# Patient Record
Sex: Male | Born: 1980 | Hispanic: No | Smoking: Never smoker
Health system: Southern US, Community
[De-identification: ages and names within clinical notes are randomized; demographics above are authoritative.]

## PROBLEM LIST (undated history)

## (undated) DIAGNOSIS — G8929 Other chronic pain: Secondary | ICD-10-CM

## (undated) DIAGNOSIS — M1990 Unspecified osteoarthritis, unspecified site: Secondary | ICD-10-CM

## (undated) DIAGNOSIS — W19XXXA Unspecified fall, initial encounter: Secondary | ICD-10-CM

## (undated) DIAGNOSIS — R519 Headache, unspecified: Secondary | ICD-10-CM

## (undated) DIAGNOSIS — Z973 Presence of spectacles and contact lenses: Secondary | ICD-10-CM

## (undated) DIAGNOSIS — G43909 Migraine, unspecified, not intractable, without status migrainosus: Secondary | ICD-10-CM

## (undated) HISTORY — PX: OTHER SURGICAL HISTORY: SHX169

## (undated) HISTORY — PX: SHOULDER SURGERY: SHX246

---

## 2000-04-06 ENCOUNTER — Emergency Department (HOSPITAL_COMMUNITY): Admission: EM | Admit: 2000-04-06 | Discharge: 2000-04-06 | Payer: Self-pay | Admitting: Emergency Medicine

## 2004-05-28 ENCOUNTER — Emergency Department (HOSPITAL_COMMUNITY): Admission: EM | Admit: 2004-05-28 | Discharge: 2004-05-28 | Payer: Self-pay | Admitting: Emergency Medicine

## 2004-10-06 ENCOUNTER — Emergency Department (HOSPITAL_COMMUNITY): Admission: EM | Admit: 2004-10-06 | Discharge: 2004-10-06 | Payer: Self-pay | Admitting: Emergency Medicine

## 2004-10-22 ENCOUNTER — Encounter: Admission: RE | Admit: 2004-10-22 | Discharge: 2004-10-22 | Payer: Self-pay | Admitting: Specialist

## 2005-08-17 ENCOUNTER — Emergency Department (HOSPITAL_COMMUNITY): Admission: EM | Admit: 2005-08-17 | Discharge: 2005-08-17 | Payer: Self-pay | Admitting: Emergency Medicine

## 2006-03-21 ENCOUNTER — Emergency Department (HOSPITAL_COMMUNITY): Admission: EM | Admit: 2006-03-21 | Discharge: 2006-03-21 | Payer: Self-pay | Admitting: Emergency Medicine

## 2007-10-09 ENCOUNTER — Emergency Department (HOSPITAL_COMMUNITY): Admission: EM | Admit: 2007-10-09 | Discharge: 2007-10-09 | Payer: Self-pay | Admitting: Emergency Medicine

## 2007-10-11 ENCOUNTER — Emergency Department (HOSPITAL_COMMUNITY): Admission: EM | Admit: 2007-10-11 | Discharge: 2007-10-11 | Payer: Self-pay | Admitting: Emergency Medicine

## 2010-07-03 ENCOUNTER — Emergency Department (HOSPITAL_COMMUNITY): Admission: EM | Admit: 2010-07-03 | Discharge: 2010-07-03 | Payer: Self-pay | Admitting: Emergency Medicine

## 2010-11-24 HISTORY — PX: SHOULDER SURGERY: SHX246

## 2013-04-30 DIAGNOSIS — F4310 Post-traumatic stress disorder, unspecified: Secondary | ICD-10-CM | POA: Insufficient documentation

## 2013-05-09 DIAGNOSIS — A6000 Herpesviral infection of urogenital system, unspecified: Secondary | ICD-10-CM | POA: Insufficient documentation

## 2013-05-09 DIAGNOSIS — N529 Male erectile dysfunction, unspecified: Secondary | ICD-10-CM | POA: Insufficient documentation

## 2013-05-19 ENCOUNTER — Emergency Department (HOSPITAL_COMMUNITY): Payer: BC Managed Care – PPO

## 2013-05-19 ENCOUNTER — Encounter (HOSPITAL_COMMUNITY): Payer: Self-pay | Admitting: Emergency Medicine

## 2013-05-19 ENCOUNTER — Emergency Department (HOSPITAL_COMMUNITY)
Admission: EM | Admit: 2013-05-19 | Discharge: 2013-05-20 | Disposition: A | Discharge status: Home / Self Care | Payer: BC Managed Care – PPO | Attending: Emergency Medicine | Admitting: Emergency Medicine

## 2013-05-19 DIAGNOSIS — S8990XA Unspecified injury of unspecified lower leg, initial encounter: Secondary | ICD-10-CM | POA: Insufficient documentation

## 2013-05-19 DIAGNOSIS — S82899A Other fracture of unspecified lower leg, initial encounter for closed fracture: Secondary | ICD-10-CM | POA: Insufficient documentation

## 2013-05-19 DIAGNOSIS — S0990XA Unspecified injury of head, initial encounter: Secondary | ICD-10-CM

## 2013-05-19 DIAGNOSIS — Y9366 Activity, soccer: Secondary | ICD-10-CM | POA: Insufficient documentation

## 2013-05-19 DIAGNOSIS — Z8679 Personal history of other diseases of the circulatory system: Secondary | ICD-10-CM | POA: Insufficient documentation

## 2013-05-19 DIAGNOSIS — S99919A Unspecified injury of unspecified ankle, initial encounter: Secondary | ICD-10-CM | POA: Insufficient documentation

## 2013-05-19 DIAGNOSIS — W219XXA Striking against or struck by unspecified sports equipment, initial encounter: Secondary | ICD-10-CM | POA: Insufficient documentation

## 2013-05-19 DIAGNOSIS — S0280XB Fracture of other specified skull and facial bones, unspecified side, initial encounter for open fracture: Secondary | ICD-10-CM | POA: Insufficient documentation

## 2013-05-19 DIAGNOSIS — Y929 Unspecified place or not applicable: Secondary | ICD-10-CM | POA: Insufficient documentation

## 2013-05-19 DIAGNOSIS — S0292XB Unspecified fracture of facial bones, initial encounter for open fracture: Secondary | ICD-10-CM

## 2013-05-19 DIAGNOSIS — S82891S Other fracture of right lower leg, sequela: Secondary | ICD-10-CM

## 2013-05-19 DIAGNOSIS — M5412 Radiculopathy, cervical region: Secondary | ICD-10-CM | POA: Insufficient documentation

## 2013-05-19 HISTORY — DX: Migraine, unspecified, not intractable, without status migrainosus: G43.909

## 2013-05-19 MED ORDER — OXYCODONE-ACETAMINOPHEN 5-325 MG PO TABS
1.0000 | ORAL_TABLET | Freq: Once | ORAL | Status: AC
  Administered 2013-05-20: 1 via ORAL
  Filled 2013-05-19: qty 1
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
## 2013-05-19 NOTE — ED Notes (Signed)
Pt states he was playing soccer and collided with another player  Pt states it knocked him unconscious  Pt states he has been bleeding from his nose since and the left side of his face is numb  Pt is also c/o left arm pain and right leg swelling  Pt states unable to bear weight on the right leg and has pain in his left leg when he walks on it   Pt has swelling noted to his right ankle with possible deformity noted  Pt has swelling also noted to the left side of his face
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
## 2013-05-20 ENCOUNTER — Emergency Department (HOSPITAL_COMMUNITY): Payer: BC Managed Care – PPO

## 2013-05-20 MED ORDER — PREDNISONE 10 MG PO TABS: ORAL_TABLET | ORAL | Status: DC

## 2013-05-20 MED ORDER — AMOXICILLIN-POT CLAVULANATE 875-125 MG PO TABS: 1.0000 | ORAL_TABLET | Freq: Two times a day (BID) | ORAL | Status: DC

## 2013-05-20 MED ORDER — OXYCODONE-ACETAMINOPHEN 5-325 MG PO TABS
1.0000 | ORAL_TABLET | ORAL | Status: DC | PRN
  Administered 2013-05-20 (×2): 2 via ORAL
  Administered 2013-05-20: 1 via ORAL
  Filled 2013-05-20 (×2): qty 2
  Filled 2013-05-20: qty 1

## 2013-05-20 MED ORDER — OXYCODONE-ACETAMINOPHEN 5-325 MG PO TABS: 1.0000 | ORAL_TABLET | Freq: Four times a day (QID) | ORAL | Status: DC | PRN
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
## 2013-05-20 NOTE — ED Notes (Signed)
Off floor for testing 

## 2013-05-20 NOTE — ED Notes (Signed)
Ortho tech called 

## 2013-05-20 NOTE — ED Notes (Signed)
Attempted to call MRI - no response

## 2013-05-20 NOTE — Progress Notes (Signed)
Pt confirms pcp is david bouska EPIC updated  

## 2013-05-20 NOTE — ED Notes (Signed)
Attempted again to call MRI. No answer.

## 2013-05-20 NOTE — ED Notes (Signed)
Attempted several times to contact MRI regarding patient's status.

## 2013-05-20 NOTE — ED Notes (Signed)
Pt complaining of pain on the lateral side of his ankle on the bone r/t the splint on his foot. Small piece of gauze placed between splint and pt's ankle.

## 2013-05-22 ENCOUNTER — Telehealth (HOSPITAL_COMMUNITY): Payer: Self-pay | Admitting: Emergency Medicine

## 2013-05-22 NOTE — ED Notes (Signed)
Pharmacy calling for prescription instruction clarification regarding quantity for Prednisone.

## 2014-11-24 HISTORY — PX: OTHER SURGICAL HISTORY: SHX169

## 2016-12-18 ENCOUNTER — Emergency Department (HOSPITAL_BASED_OUTPATIENT_CLINIC_OR_DEPARTMENT_OTHER)
Admission: EM | Admit: 2016-12-18 | Discharge: 2016-12-19 | Disposition: A | Discharge status: Home / Self Care | Payer: BLUE CROSS/BLUE SHIELD | Attending: Emergency Medicine | Admitting: Emergency Medicine

## 2016-12-18 ENCOUNTER — Encounter (HOSPITAL_BASED_OUTPATIENT_CLINIC_OR_DEPARTMENT_OTHER): Payer: Self-pay | Admitting: *Deleted

## 2016-12-18 ENCOUNTER — Emergency Department (HOSPITAL_BASED_OUTPATIENT_CLINIC_OR_DEPARTMENT_OTHER): Payer: BLUE CROSS/BLUE SHIELD

## 2016-12-18 DIAGNOSIS — R079 Chest pain, unspecified: Secondary | ICD-10-CM | POA: Diagnosis present

## 2016-12-18 DIAGNOSIS — R0789 Other chest pain: Secondary | ICD-10-CM

## 2016-12-18 DIAGNOSIS — R0602 Shortness of breath: Secondary | ICD-10-CM | POA: Insufficient documentation

## 2016-12-18 DIAGNOSIS — R05 Cough: Secondary | ICD-10-CM | POA: Insufficient documentation

## 2016-12-18 NOTE — ED Triage Notes (Signed)
Pt with cough and congestion x 3 weeks began having chest pain x 8 hours ago. Reports dizziness and blurred vision SHOB.

## 2016-12-19 LAB — BASIC METABOLIC PANEL
Anion gap: 10 (ref 5–15)
BUN: 17 mg/dL (ref 6–20)
CO2: 26 mmol/L (ref 22–32)
CREATININE: 0.86 mg/dL (ref 0.61–1.24)
Calcium: 9.4 mg/dL (ref 8.9–10.3)
Chloride: 101 mmol/L (ref 101–111)
GFR calc Af Amer: >60 mL/min (ref >60)
Glucose, Bld: 94 mg/dL (ref 65–99)
Potassium: 3.6 mmol/L (ref 3.5–5.1)
SODIUM: 137 mmol/L (ref 135–145)

## 2016-12-19 LAB — CBC WITH DIFFERENTIAL/PLATELET
Basophils Absolute: 0 10*3/uL (ref 0.0–0.1)
Basophils Relative: 0 %
EOS ABS: 0.1 10*3/uL (ref 0.0–0.7)
EOS PCT: 1 %
HCT: 45.4 % (ref 39.0–52.0)
Hemoglobin: 15.4 g/dL (ref 13.0–17.0)
LYMPHS ABS: 1.9 10*3/uL (ref 0.7–4.0)
Lymphocytes Relative: 25 %
MCH: 27.6 pg (ref 26.0–34.0)
MCHC: 33.9 g/dL (ref 30.0–36.0)
MCV: 81.4 fL (ref 78.0–100.0)
Monocytes Absolute: 0.7 10*3/uL (ref 0.1–1.0)
Monocytes Relative: 9 %
Neutro Abs: 4.9 10*3/uL (ref 1.7–7.7)
Neutrophils Relative %: 65 %
PLATELETS: 221 10*3/uL (ref 150–400)
RBC: 5.58 MIL/uL (ref 4.22–5.81)
RDW: 13.5 % (ref 11.5–15.5)
WBC: 7.6 10*3/uL (ref 4.0–10.5)

## 2016-12-19 LAB — TROPONIN I

## 2016-12-19 MED ORDER — NAPROXEN 500 MG PO TABS: 500.0000 mg | ORAL_TABLET | Freq: Two times a day (BID) | ORAL | 0 refills | Status: DC | PRN

## 2016-12-19 MED ORDER — KETOROLAC TROMETHAMINE 15 MG/ML IJ SOLN
15.0000 mg | Freq: Once | INTRAMUSCULAR | Status: AC
  Administered 2016-12-19: 15 mg via INTRAVENOUS
  Filled 2016-12-19: qty 1

## 2016-12-19 NOTE — ED Notes (Signed)
C/o left side cp onset yesterday  Has had cough for 3 weeks,  Pain increased w inspiration and movement  States passed out 3 times at home

## 2016-12-19 NOTE — ED Notes (Signed)
ED Provider at bedside. 
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
## 2017-12-30 DIAGNOSIS — S83249A Other tear of medial meniscus, current injury, unspecified knee, initial encounter: Secondary | ICD-10-CM | POA: Insufficient documentation

## 2018-02-18 ENCOUNTER — Ambulatory Visit: Payer: BLUE CROSS/BLUE SHIELD | Admitting: Family Medicine

## 2018-02-23 DIAGNOSIS — K4190 Unilateral femoral hernia, without obstruction or gangrene, not specified as recurrent: Secondary | ICD-10-CM | POA: Insufficient documentation

## 2018-03-09 DIAGNOSIS — M25552 Pain in left hip: Principal | ICD-10-CM

## 2018-03-09 DIAGNOSIS — M25551 Pain in right hip: Secondary | ICD-10-CM | POA: Insufficient documentation

## 2018-03-20 ENCOUNTER — Emergency Department (HOSPITAL_BASED_OUTPATIENT_CLINIC_OR_DEPARTMENT_OTHER)
Admission: EM | Admit: 2018-03-20 | Discharge: 2018-03-20 | Disposition: A | Discharge status: Home / Self Care | Payer: BLUE CROSS/BLUE SHIELD | Attending: Emergency Medicine | Admitting: Emergency Medicine

## 2018-03-20 ENCOUNTER — Other Ambulatory Visit: Payer: Self-pay

## 2018-03-20 ENCOUNTER — Encounter (HOSPITAL_BASED_OUTPATIENT_CLINIC_OR_DEPARTMENT_OTHER): Payer: Self-pay | Admitting: *Deleted

## 2018-03-20 ENCOUNTER — Emergency Department (HOSPITAL_BASED_OUTPATIENT_CLINIC_OR_DEPARTMENT_OTHER): Payer: BLUE CROSS/BLUE SHIELD

## 2018-03-20 DIAGNOSIS — Y929 Unspecified place or not applicable: Secondary | ICD-10-CM | POA: Diagnosis not present

## 2018-03-20 DIAGNOSIS — R1032 Left lower quadrant pain: Secondary | ICD-10-CM | POA: Diagnosis not present

## 2018-03-20 DIAGNOSIS — Y9366 Activity, soccer: Secondary | ICD-10-CM | POA: Insufficient documentation

## 2018-03-20 DIAGNOSIS — S76212A Strain of adductor muscle, fascia and tendon of left thigh, initial encounter: Secondary | ICD-10-CM

## 2018-03-20 DIAGNOSIS — S76801A Unspecified injury of other specified muscles, fascia and tendons at thigh level, right thigh, initial encounter: Secondary | ICD-10-CM | POA: Diagnosis present

## 2018-03-20 DIAGNOSIS — Y998 Other external cause status: Secondary | ICD-10-CM | POA: Insufficient documentation

## 2018-03-20 DIAGNOSIS — W51XXXA Accidental striking against or bumped into by another person, initial encounter: Secondary | ICD-10-CM | POA: Diagnosis not present

## 2018-03-20 LAB — CBC WITH DIFFERENTIAL/PLATELET
BASOS ABS: 0 10*3/uL (ref 0.0–0.1)
Basophils Relative: 0 %
EOS ABS: 0 10*3/uL (ref 0.0–0.7)
Eosinophils Relative: 0 %
HCT: 40.2 % (ref 39.0–52.0)
Hemoglobin: 13.8 g/dL (ref 13.0–17.0)
LYMPHS ABS: 1.4 10*3/uL (ref 0.7–4.0)
Lymphocytes Relative: 12 %
MCH: 27.7 pg (ref 26.0–34.0)
MCHC: 34.3 g/dL (ref 30.0–36.0)
MCV: 80.6 fL (ref 78.0–100.0)
Monocytes Absolute: 0.9 10*3/uL (ref 0.1–1.0)
Monocytes Relative: 8 %
NEUTROS PCT: 80 %
Neutro Abs: 9.2 10*3/uL — ABNORMAL HIGH (ref 1.7–7.7)
Platelets: 229 10*3/uL (ref 150–400)
RBC: 4.99 MIL/uL (ref 4.22–5.81)
RDW: 13.3 % (ref 11.5–15.5)
WBC: 11.5 10*3/uL — AB (ref 4.0–10.5)

## 2018-03-20 LAB — BASIC METABOLIC PANEL
Anion gap: 10 (ref 5–15)
BUN: 19 mg/dL (ref 6–20)
CHLORIDE: 103 mmol/L (ref 101–111)
CO2: 22 mmol/L (ref 22–32)
CREATININE: 0.9 mg/dL (ref 0.61–1.24)
Calcium: 9 mg/dL (ref 8.9–10.3)
Glucose, Bld: 79 mg/dL (ref 65–99)
POTASSIUM: 3.7 mmol/L (ref 3.5–5.1)
SODIUM: 135 mmol/L (ref 135–145)

## 2018-03-20 MED ORDER — IOPAMIDOL (ISOVUE-300) INJECTION 61%
100.0000 mL | Freq: Once | INTRAVENOUS | Status: AC | PRN
  Administered 2018-03-20: 100 mL via INTRAVENOUS

## 2018-03-20 MED ORDER — KETOROLAC TROMETHAMINE 30 MG/ML IJ SOLN
30.0000 mg | Freq: Once | INTRAMUSCULAR | Status: AC
  Administered 2018-03-20: 30 mg via INTRAVENOUS
  Filled 2018-03-20: qty 1

## 2018-03-20 MED ORDER — OXYCODONE-ACETAMINOPHEN 5-325 MG PO TABS: 1.0000 | ORAL_TABLET | Freq: Three times a day (TID) | ORAL | 0 refills | Status: DC | PRN

## 2018-03-20 MED ORDER — MORPHINE SULFATE (PF) 4 MG/ML IV SOLN
4.0000 mg | Freq: Once | INTRAVENOUS | Status: AC
  Administered 2018-03-20: 4 mg via INTRAVENOUS
  Filled 2018-03-20: qty 1

## 2018-03-20 MED ORDER — DIAZEPAM 5 MG/ML IJ SOLN
2.5000 mg | Freq: Once | INTRAMUSCULAR | Status: AC
  Administered 2018-03-20: 2.5 mg via INTRAVENOUS
  Filled 2018-03-20: qty 2

## 2018-03-20 MED ORDER — CYCLOBENZAPRINE HCL 10 MG PO TABS: 10.0000 mg | ORAL_TABLET | Freq: Two times a day (BID) | ORAL | 0 refills | Status: DC | PRN
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
## 2018-03-20 NOTE — Discharge Instructions (Signed)
It was my pleasure taking care of you today!   Continue Mobic as needed. Flexeril is your muscle relaxer to take as needed. Percocet is to take only as needed for severe pain (see information about this medication below). Warm compresses to the area will help as well.   You can call the sports medicine doctor listed to schedule a follow up appointment or follow up with your primary care doctor.   Return to ER for new or worsening symptoms, any additional concerns.    Do not drink alcohol, drive or participate in any other potentially dangerous activities while taking opiate pain medication as it may make you sleepy. Do not take this medication with any other sedating medications, either prescription or over-the-counter. If you were prescribed Percocet or Vicodin, do not take these with acetaminophen (Tylenol) as it is already contained within these medications.   This medication is an opiate (or narcotic) pain medication and can be habit forming.  Use it as little as possible to achieve adequate pain control.  Do not use or use it with extreme caution if you have a history of opiate abuse or dependence. This medication is intended for your use only - do not give any to anyone else and keep it in a secure place where nobody else, especially children, have access to it. It will also cause or worsen constipation, so you may want to consider taking an over-the-counter stool softener while you are taking this medication.

## 2018-03-20 NOTE — ED Notes (Signed)
PA at bedside.

## 2018-03-24 ENCOUNTER — Ambulatory Visit: Payer: BLUE CROSS/BLUE SHIELD | Admitting: Family Medicine

## 2018-03-24 DIAGNOSIS — M25551 Pain in right hip: Secondary | ICD-10-CM

## 2018-03-24 DIAGNOSIS — M25552 Pain in left hip: Secondary | ICD-10-CM

## 2018-03-24 MED ORDER — HYDROCODONE-ACETAMINOPHEN 7.5-325 MG PO TABS: 1.0000 | ORAL_TABLET | Freq: Four times a day (QID) | ORAL | 0 refills | Status: DC | PRN

## 2018-03-24 NOTE — Patient Instructions (Signed)
You have a left adductor strain. 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 Use crutches for next week and follow up with me at the end of next week. Hopefully we can start you in physical therapy at that time. Icing 15 minutes at a time 3-4 times a day. Meloxicam daily with food for pain and inflammation. Take flexeril as needed for muscle spasms. Take Oxycodone as needed for severe pain (we could try hydrocodone instead, let me know if you want to do this). Topical salon pas or capsaicin up to 4 times a day can be used for pain as well.

## 2018-03-28 ENCOUNTER — Encounter: Payer: Self-pay | Admitting: Family Medicine

## 2018-03-28 NOTE — Assessment & Plan Note (Signed)
his acute injury suggests left > right adductor strains.  However, he's had 2+ months of groin pain that has waxed and waned.  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  Asked he get a copy of CD to drop off for me to review.  In meantime, crutches, icing meloxicam with flexeril and oxycodone as needed.  F/u at end of next week.
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
## 2018-04-01 ENCOUNTER — Encounter: Payer: Self-pay | Admitting: Family Medicine

## 2018-04-01 ENCOUNTER — Ambulatory Visit: Payer: BLUE CROSS/BLUE SHIELD | Admitting: Family Medicine

## 2018-04-01 DIAGNOSIS — M25551 Pain in right hip: Secondary | ICD-10-CM | POA: Diagnosis not present

## 2018-04-01 DIAGNOSIS — M25552 Pain in left hip: Secondary | ICD-10-CM | POA: Diagnosis not present

## 2018-04-01 MED ORDER — MELOXICAM 15 MG PO TABS: 15.0000 mg | ORAL_TABLET | Freq: Every day | ORAL | 1 refills | Status: DC

## 2018-04-01 MED ORDER — TIZANIDINE HCL 4 MG PO TABS: 4.0000 mg | ORAL_TABLET | Freq: Three times a day (TID) | ORAL | 1 refills | Status: DC | PRN

## 2018-04-01 MED ORDER — HYDROCODONE-ACETAMINOPHEN 7.5-325 MG PO TABS: 1.0000 | ORAL_TABLET | Freq: Four times a day (QID) | ORAL | 0 refills | Status: DC | PRN

## 2018-04-01 NOTE — Patient Instructions (Addendum)
You have a left adductor strain and osteitis pubis. Crutches if needed. Icing 15 minutes at a time 3-4 times a day. Meloxicam daily with food for pain and inflammation. Take tizanidine as needed for muscle spasms. Take norco as needed for severe pain. Topical salon pas or capsaicin up to 4 times a day can be used for pain as well. Start physical therapy the week of the 20th for both issues at [facility] with [facility]. Follow up with me in 2 weeks - continue out of work in the meantime.

## 2018-04-03 ENCOUNTER — Encounter: Payer: Self-pay | Admitting: Family Medicine
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
## 2018-04-03 NOTE — Assessment & Plan Note (Signed)
independently reviewed MRI today.  Consistent with acute adductor strains on top of underlying osteitis pubis.  Crutches if needed.  Discussed the usual protracted course of osteitis pubis and stressed importance of rest and slow resumption of activities - we will review this when asymptomatic.  Icing, meloxicam.  Tizanidine and norco if needed.  Start physical therapy in about 2 weeks.  F/u in 2 weeks.  Out of work.

## 2018-04-05 ENCOUNTER — Telehealth: Payer: Self-pay | Admitting: Family Medicine

## 2018-04-05 NOTE — Telephone Encounter (Signed)
Does it need to includes dates prior to the May 1st visit?

## 2018-04-05 NOTE — Telephone Encounter (Signed)
Patient was informed. He will be coming by the office today to pick up

## 2018-04-05 NOTE — Telephone Encounter (Signed)
Patient requesting a work note to turn into employer from his May 1st office visit

## 2018-04-05 NOTE — Telephone Encounter (Signed)
Ok note printed.  Thanks!

## 2018-04-05 NOTE — Telephone Encounter (Signed)
Spoke to patient. He asked if you can write from his emergency room visit until his second follow up on May 9th

## 2018-04-15 ENCOUNTER — Ambulatory Visit: Payer: BLUE CROSS/BLUE SHIELD | Admitting: Family Medicine

## 2018-04-15 ENCOUNTER — Encounter: Payer: Self-pay | Admitting: Family Medicine

## 2018-04-15 DIAGNOSIS — M25552 Pain in left hip: Secondary | ICD-10-CM

## 2018-04-15 DIAGNOSIS — M25551 Pain in right hip: Secondary | ICD-10-CM

## 2018-04-15 NOTE — Patient Instructions (Signed)
You have a left adductor strain and osteitis pubis. Crutches if needed. Icing 15 minutes at a time 3-4 times a day. Meloxicam daily with food for pain and inflammation. Take tizanidine as needed for muscle spasms. Take norco as needed for severe pain - call me if you need a refill on this - I would refill it one more time if necessary. Topical salon pas or capsaicin up to 4 times a day can be used for pain as well. Ok to start physical therapy now.   Follow up with me in 3-4 weeks - continue out of work in the meantime.

## 2018-04-18 ENCOUNTER — Encounter: Payer: Self-pay | Admitting: Family Medicine

## 2018-04-18 NOTE — Assessment & Plan Note (Signed)
2/2 adductors strains in addition to underlying osteitis pubis.  Crutches if needed.  Continue with icing, meloxicam with tizanidine and norco if needed.  Topical salon pas if needed.  Start physical therapy.  F/u in 3-4 weeks.  Remain out of work.
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
## 2018-05-06 ENCOUNTER — Ambulatory Visit: Payer: BLUE CROSS/BLUE SHIELD | Admitting: Family Medicine

## 2018-05-13 ENCOUNTER — Encounter: Payer: Self-pay | Admitting: Family Medicine

## 2018-05-13 ENCOUNTER — Ambulatory Visit: Payer: BLUE CROSS/BLUE SHIELD | Admitting: Family Medicine

## 2018-05-13 DIAGNOSIS — M25552 Pain in left hip: Secondary | ICD-10-CM | POA: Diagnosis not present

## 2018-05-13 DIAGNOSIS — M25551 Pain in right hip: Secondary | ICD-10-CM | POA: Diagnosis not present
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
## 2018-05-13 NOTE — Patient Instructions (Signed)
Your adductor strains have improved but the osteitis pubis is still your major issue at this point. Icing 15 minutes at a time 3-4 times a day. Meloxicam with tizanidine, norco as needed. Topical salon pas or capsaicin up to 4 times a day can be used for pain as well. Continue with the physical therapy. Don't do any brisk walking, running, or elliptical now and until I see you back in the office. Low resistance cycling, exercising in the pool are ok only if they don't worsen your pain. Follow up with me in 6 weeks.

## 2018-05-13 NOTE — Assessment & Plan Note (Signed)
adductor strains improved though still dealing with osteitis pubis.  Icing, meloxicam with tizanidine and norco as needed.  Continue physical therapy and home exercises but advised against running, brisk walking, cutting, elliptical.  F/u in 6 weeks.

## 2018-06-24 ENCOUNTER — Ambulatory Visit (HOSPITAL_BASED_OUTPATIENT_CLINIC_OR_DEPARTMENT_OTHER)
Admission: RE | Admit: 2018-06-24 | Discharge: 2018-06-24 | Disposition: A | Discharge status: Home / Self Care | Payer: BLUE CROSS/BLUE SHIELD | Source: Ambulatory Visit | Attending: Family Medicine | Admitting: Family Medicine

## 2018-06-24 ENCOUNTER — Ambulatory Visit: Payer: BLUE CROSS/BLUE SHIELD | Admitting: Family Medicine

## 2018-06-24 ENCOUNTER — Encounter: Payer: Self-pay | Admitting: Family Medicine

## 2018-06-24 VITALS — BP 105/80

## 2018-06-24 DIAGNOSIS — M25552 Pain in left hip: Secondary | ICD-10-CM | POA: Diagnosis not present

## 2018-06-24 DIAGNOSIS — R102 Pelvic and perineal pain: Secondary | ICD-10-CM | POA: Diagnosis not present

## 2018-06-24 DIAGNOSIS — M25551 Pain in right hip: Secondary | ICD-10-CM | POA: Diagnosis not present

## 2018-06-24 NOTE — Patient Instructions (Signed)
Get x-rays downstairs as you leave today. We will contact you with these results and then go ahead with MRI assuming these look normal.

## 2018-06-24 NOTE — Addendum Note (Signed)
Addended by: Annita BrodMOORE, [place] C on: 06/24/2018 01:59 PM   Modules accepted: Orders
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
## 2018-06-25 NOTE — Addendum Note (Signed)
Addended by: Annita BrodMOORE, Dessie Delcarlo C on: 06/25/2018 08:31 AM   Modules accepted: Orders

## 2018-06-26 ENCOUNTER — Ambulatory Visit (HOSPITAL_BASED_OUTPATIENT_CLINIC_OR_DEPARTMENT_OTHER): Payer: BLUE CROSS/BLUE SHIELD

## 2018-06-28 ENCOUNTER — Inpatient Hospital Stay: Admission: RE | Admit: 2018-06-28 | Payer: BLUE CROSS/BLUE SHIELD | Source: Ambulatory Visit

## 2018-07-03 ENCOUNTER — Inpatient Hospital Stay: Admission: RE | Admit: 2018-07-03 | Payer: BLUE CROSS/BLUE SHIELD | Source: Ambulatory Visit

## 2018-07-08 ENCOUNTER — Ambulatory Visit
Admission: RE | Admit: 2018-07-08 | Discharge: 2018-07-08 | Disposition: A | Discharge status: Home / Self Care | Payer: BLUE CROSS/BLUE SHIELD | Source: Ambulatory Visit | Attending: Family Medicine | Admitting: Family Medicine

## 2018-07-08 DIAGNOSIS — M25551 Pain in right hip: Secondary | ICD-10-CM

## 2018-07-08 DIAGNOSIS — M25552 Pain in left hip: Secondary | ICD-10-CM

## 2018-07-08 DIAGNOSIS — R102 Pelvic and perineal pain: Secondary | ICD-10-CM

## 2018-07-15 ENCOUNTER — Encounter: Payer: Self-pay | Admitting: *Deleted

## 2018-07-15 ENCOUNTER — Telehealth: Payer: Self-pay | Admitting: Family Medicine

## 2018-07-15 NOTE — Telephone Encounter (Signed)
Ok to extend work note until that date.  Thanks!

## 2018-07-15 NOTE — Telephone Encounter (Signed)
Patient is unable to schedule 6wk f/u until September 23rd. Patient is asking if the note for work can be extended until then

## 2018-07-15 NOTE — Telephone Encounter (Signed)
Letter written and placed up front. 

## 2018-07-15 NOTE — Telephone Encounter (Signed)
Patient is meeting with employer tomorrow, 8/23, requesting note by tomorrow

## 2018-08-09 ENCOUNTER — Ambulatory Visit: Payer: BLUE CROSS/BLUE SHIELD | Admitting: Family Medicine

## 2018-08-16 ENCOUNTER — Encounter: Payer: Self-pay | Admitting: Family Medicine

## 2018-08-16 ENCOUNTER — Ambulatory Visit: Payer: BLUE CROSS/BLUE SHIELD | Admitting: Family Medicine

## 2018-08-16 VITALS — BP 114/81 | HR 56 | Ht 72.0 in | Wt 218.0 lb

## 2018-08-16 DIAGNOSIS — M25551 Pain in right hip: Secondary | ICD-10-CM

## 2018-08-16 DIAGNOSIS — M25552 Pain in left hip: Secondary | ICD-10-CM | POA: Diagnosis not present

## 2018-08-16 DIAGNOSIS — R102 Pelvic and perineal pain: Secondary | ICD-10-CM | POA: Diagnosis not present

## 2018-08-16 NOTE — Patient Instructions (Signed)
Don't do the exercises if they're causing pain. The same goes for going to physical therapy. Icing as needed for pain. Meloxicam, tizanidine if needed. Topical salon pas or capsaicin up to 4 times a day can be used for pain as well. Tentatively follow up in 3 months.
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
## 2018-10-19 ENCOUNTER — Telehealth: Payer: Self-pay | Admitting: Family Medicine

## 2018-10-19 NOTE — Telephone Encounter (Signed)
Patient called stating he is still experiencing pain in his pelvic region.  Patient asking if he needs to follow up or needs to be referred to a surgeon at this point

## 2018-10-19 NOTE — Telephone Encounter (Signed)
I think that's reasonable at this point given the extensive conservative treatment he's gone through.  It's not a common condition that needs surgical intervention so Gunnar Fusiaula can you call different orthopedic groups to see if they have a surgeon (not a primary care sports medicine doctor) who treats osteitis pubis.  Start with Dr. Caswell [place]winStubbs in [place].  Thanks!

## 2018-10-20 NOTE — Addendum Note (Signed)
Addended by: Kathi SimpersWISE, Chaitanya Amedee F on: 10/20/2018 01:49 PM   Modules accepted: Orders

## 2018-10-20 NOTE — Telephone Encounter (Signed)
Information has been sent to Dr. Caswell [place]Stubbs in [place]. Spoke to patient and told him that we are doing a referral and that he did not need to do a follow up.

## 2018-10-26 NOTE — Telephone Encounter (Signed)
Patient has been scheduled on 12/4 with Dr. Caswell CorwinStubbs' PA, Watt ClimesAmanda Tipton

## 2018-11-22 ENCOUNTER — Telehealth: Payer: Self-pay | Admitting: Family Medicine

## 2018-11-22 NOTE — Telephone Encounter (Signed)
Spoke to patient and gave him information.

## 2018-11-22 NOTE — Telephone Encounter (Signed)
Patient would like to be referred to a different orthopedic surgeon. Requesting next recommendation.  No location preferences

## 2018-11-22 NOTE — Telephone Encounter (Signed)
If he wanted evaluation elsewhere, to my knowledge the only other place that has a Careers advisersurgeon with the experience to treat osteitis pubis would be at [facility] - could check with their [place] office to see if any of their surgeons treat this vs if he would have to go to [place].

## 2019-02-02 ENCOUNTER — Other Ambulatory Visit: Payer: Self-pay | Admitting: Orthopedic Surgery

## 2019-02-02 DIAGNOSIS — M7070 Other bursitis of hip, unspecified hip: Secondary | ICD-10-CM

## 2019-02-08 ENCOUNTER — Other Ambulatory Visit: Payer: BLUE CROSS/BLUE SHIELD

## 2021-06-27 DIAGNOSIS — Z01812 Encounter for preprocedural laboratory examination: Secondary | ICD-10-CM | POA: Diagnosis not present

## 2021-06-28 NOTE — Progress Notes (Signed)
DUE TO COVID-19 ONLY ONE VISITOR IS ALLOWED TO COME WITH YOU AND STAY IN THE WAITING ROOM ONLY DURING PRE OP AND PROCEDURE DAY OF SURGERY.  2 VISITOR  MAY VISIT WITH YOU AFTER SURGERY IN YOUR PRIVATE ROOM DURING VISITING HOURS ONLY!  YOU NEED TO HAVE A COVID 19 TEST ON______AME@_  @_from  8am-3pm _____, THIS TEST MUST BE DONE BEFORE SURGERY,  Covid test is done at [address] [place], [place] [address].  This is a drive thru.  No appt required. Please see map.                 Your procedure is scheduled on:         07/11/2021   Report to [facility] Main  Entrance   Report to admitting at     1015 AM     Call this number if you have problems the morning of surgery [phone]    REMEMBER: NO  SOLID FOOD CANDY OR GUM AFTER MIDNIGHT. CLEAR LIQUIDS UNTIL   0915am         . NOTHING BY MOUTH EXCEPT CLEAR LIQUIDS UNTIL  0915am   . PLEASE FINISH ENSURE DRINK PER SURGEON ORDER  WHICH NEEDS TO BE COMPLETED AT  0915am     .      CLEAR LIQUID DIET   Foods Allowed                                                                    Coffee and tea, regular and decaf                            Fruit ices (not with fruit pulp)                                      Iced Popsicles                                    Carbonated beverages, regular and diet                                    Cranberry, grape and apple juices Sports drinks like Gatorade Lightly seasoned clear broth or consume(fat free) Sugar, honey syrup ___________________________________________________________________      BRUSH YOUR TEETH MORNING OF SURGERY AND RINSE YOUR MOUTH OUT, NO CHEWING GUM CANDY OR MINTS.     Take these medicines the morning of surgery with A SIP OF WATER:  none   DO NOT TAKE ANY DIABETIC MEDICATIONS DAY OF YOUR SURGERY                               You may not have any metal on your body including hair pins and              piercings  Do not wear jewelry, make-up, lotions, powders or  perfumes, deodorant  Do not wear nail polish on your fingernails.  Do not shave  48 hours prior to surgery.              Men may shave face and neck.   Do not bring valuables to the hospital. [place].  Contacts, dentures or bridgework may not be worn into surgery.  Leave suitcase in the car. After surgery it may be brought to your room.     Patients discharged the day of surgery will not be allowed to drive home. IF YOU ARE HAVING SURGERY AND GOING HOME THE SAME DAY, YOU MUST HAVE AN ADULT TO DRIVE YOU HOME AND BE WITH YOU FOR 24 HOURS. YOU MAY GO HOME BY TAXI OR UBER OR ORTHERWISE, BUT AN ADULT MUST ACCOMPANY YOU HOME AND STAY WITH YOU FOR 24 HOURS.  Name and phone number of your driver:  Special Instructions: N/A              Please read over the following fact sheets you were given: _____________________________________________________________________  [facility] - Preparing for Surgery Before surgery, you can play an important role.  Because skin is not sterile, your skin needs to be as free of germs as possible.  You can reduce the number of germs on your skin by washing with CHG (chlorahexidine gluconate) soap before surgery.  CHG is an antiseptic cleaner which kills germs and bonds with the skin to continue killing germs even after washing. Please DO NOT use if you have an allergy to CHG or antibacterial soaps.  If your skin becomes reddened/irritated stop using the CHG and inform your nurse when you arrive at Short Stay. Do not shave (including legs and underarms) for at least 48 hours prior to the first CHG shower.  You may shave your face/neck. Please follow these instructions carefully:  1.  Shower with CHG Soap the night before surgery and the  morning of Surgery.  2.  If you choose to wash your hair, wash your hair first as usual with your  normal  shampoo.  3.  After you shampoo, rinse your hair and body thoroughly  to remove the  shampoo.                           4.  Use CHG as you would any other liquid soap.  You can apply chg directly  to the skin and wash                       Gently with a scrungie or clean washcloth.  5.  Apply the CHG Soap to your body ONLY FROM THE NECK DOWN.   Do not use on face/ open                           Wound or open sores. Avoid contact with eyes, ears mouth and genitals (private parts).                       Wash face,  Genitals (private parts) with your normal soap.             6.  Wash thoroughly, paying special attention to the area where your surgery  will be performed.  7.  Thoroughly rinse your body with  warm water from the neck down.  8.  DO NOT shower/wash with your normal soap after using and rinsing off  the CHG Soap.                9.  Pat yourself dry with a clean towel.            10.  Wear clean pajamas.            11.  Place clean sheets on your bed the night of your first shower and do not  sleep with pets. Day of Surgery : Do not apply any lotions/deodorants the morning of surgery.  Please wear clean clothes to the hospital/surgery center.  FAILURE TO FOLLOW THESE INSTRUCTIONS MAY RESULT IN THE CANCELLATION OF YOUR SURGERY PATIENT SIGNATURE_________________________________  NURSE SIGNATURE__________________________________  ________________________________________________________________________

## 2021-07-01 ENCOUNTER — Encounter (HOSPITAL_COMMUNITY): Payer: Self-pay

## 2021-07-01 ENCOUNTER — Encounter (HOSPITAL_COMMUNITY)
Admission: RE | Admit: 2021-07-01 | Discharge: 2021-07-01 | Disposition: A | Discharge status: Home / Self Care | Payer: 59 | Source: Ambulatory Visit | Attending: Orthopedic Surgery | Admitting: Orthopedic Surgery

## 2021-07-01 ENCOUNTER — Other Ambulatory Visit: Payer: Self-pay

## 2021-07-01 DIAGNOSIS — Z01812 Encounter for preprocedural laboratory examination: Secondary | ICD-10-CM | POA: Diagnosis not present

## 2021-07-01 LAB — CBC
HCT: 45.6 % (ref 39.0–52.0)
Hemoglobin: 14.5 g/dL (ref 13.0–17.0)
MCH: 27.1 pg (ref 26.0–34.0)
MCHC: 31.8 g/dL (ref 30.0–36.0)
MCV: 85.2 fL (ref 80.0–100.0)
Platelets: 226 10*3/uL (ref 150–400)
RBC: 5.35 MIL/uL (ref 4.22–5.81)
RDW: 13.1 % (ref 11.5–15.5)
WBC: 4.6 10*3/uL (ref 4.0–10.5)
nRBC: 0 % (ref 0.0–0.2)

## 2021-07-01 NOTE — Progress Notes (Signed)
Anesthesia Review:  PCP: DR Tracey Harries  Cardiologist : none  Chest x-ray : EKG : Echo : Stress test: Cardiac Cath :  Activity level: can do a flight of stairs without difficulty  Sleep Study/ CPAP : none  Fasting Blood Sugar :      / Checks Blood Sugar -- times a day:   Blood Thinner/ Instructions /Last Dose: ASA / Instructions/ Last Dose :

## 2021-07-11 ENCOUNTER — Ambulatory Visit (HOSPITAL_COMMUNITY)
Admission: RE | Admit: 2021-07-11 | Discharge: 2021-07-11 | Disposition: A | Discharge status: Home / Self Care | Payer: 59 | Source: Ambulatory Visit | Attending: Orthopedic Surgery | Admitting: Orthopedic Surgery

## 2021-07-11 ENCOUNTER — Ambulatory Visit (HOSPITAL_COMMUNITY): Payer: 59 | Admitting: Certified Registered Nurse Anesthetist

## 2021-07-11 ENCOUNTER — Encounter (HOSPITAL_COMMUNITY): Payer: Self-pay | Admitting: Orthopedic Surgery

## 2021-07-11 ENCOUNTER — Encounter (HOSPITAL_COMMUNITY)
Admission: RE | Disposition: A | Discharge status: Home / Self Care | Payer: Self-pay | Source: Ambulatory Visit | Attending: Orthopedic Surgery

## 2021-07-11 DIAGNOSIS — X58XXXA Exposure to other specified factors, initial encounter: Secondary | ICD-10-CM | POA: Insufficient documentation

## 2021-07-11 DIAGNOSIS — M25312 Other instability, left shoulder: Secondary | ICD-10-CM | POA: Diagnosis not present

## 2021-07-11 DIAGNOSIS — S42292A Other displaced fracture of upper end of left humerus, initial encounter for closed fracture: Secondary | ICD-10-CM | POA: Diagnosis not present

## 2021-07-11 DIAGNOSIS — S43492A Other sprain of left shoulder joint, initial encounter: Secondary | ICD-10-CM | POA: Insufficient documentation

## 2021-07-11 HISTORY — PX: BANKART REPAIR: SHX5173

## 2021-07-11 SURGERY — REPAIR, SHOULDER, ARTHROSCOPIC, BANKART
Anesthesia: General | Site: Hip | Laterality: Left

## 2021-07-11 MED ORDER — LIDOCAINE 2% (20 MG/ML) 5 ML SYRINGE
INTRAMUSCULAR | Status: AC
  Filled 2021-07-11: qty 5

## 2021-07-11 MED ORDER — SUGAMMADEX SODIUM 200 MG/2ML IV SOLN
INTRAVENOUS | Status: DC | PRN
Start: 2021-07-11 — End: 2021-07-11
  Administered 2021-07-11: 200 mg via INTRAVENOUS

## 2021-07-11 MED ORDER — LACTATED RINGERS IV SOLN: INTRAVENOUS | Status: DC

## 2021-07-11 MED ORDER — MIDAZOLAM HCL 5 MG/5ML IJ SOLN
INTRAMUSCULAR | Status: DC | PRN
  Administered 2021-07-11: 1 mg via INTRAVENOUS

## 2021-07-11 MED ORDER — FENTANYL CITRATE (PF) 100 MCG/2ML IJ SOLN
INTRAMUSCULAR | Status: AC
  Filled 2021-07-11: qty 2

## 2021-07-11 MED ORDER — BUPIVACAINE LIPOSOME 1.3 % IJ SUSP
INTRAMUSCULAR | Status: DC | PRN
  Administered 2021-07-11: 10 mL via PERINEURAL

## 2021-07-11 MED ORDER — ONDANSETRON HCL 4 MG/2ML IJ SOLN
INTRAMUSCULAR | Status: DC | PRN
  Administered 2021-07-11: 4 mg via INTRAVENOUS

## 2021-07-11 MED ORDER — ROCURONIUM BROMIDE 10 MG/ML (PF) SYRINGE
PREFILLED_SYRINGE | INTRAVENOUS | Status: DC | PRN
Start: 2021-07-11 — End: 2021-07-11
  Administered 2021-07-11: 70 mg via INTRAVENOUS

## 2021-07-11 MED ORDER — SODIUM CHLORIDE 0.9 % IR SOLN
Status: DC | PRN
  Administered 2021-07-11: 15000 mL

## 2021-07-11 MED ORDER — FENTANYL CITRATE (PF) 100 MCG/2ML IJ SOLN
INTRAMUSCULAR | Status: DC | PRN
  Administered 2021-07-11: 50 ug via INTRAVENOUS

## 2021-07-11 MED ORDER — ONDANSETRON HCL 4 MG/2ML IJ SOLN
INTRAMUSCULAR | Status: AC
  Filled 2021-07-11: qty 2

## 2021-07-11 MED ORDER — OXYCODONE HCL 5 MG PO TABS: 5.0000 mg | ORAL_TABLET | Freq: Once | ORAL | Status: DC | PRN

## 2021-07-11 MED ORDER — EPHEDRINE 5 MG/ML INJ
INTRAVENOUS | Status: AC
  Filled 2021-07-11: qty 5

## 2021-07-11 MED ORDER — FENTANYL CITRATE (PF) 100 MCG/2ML IJ SOLN
50.0000 ug | Freq: Once | INTRAMUSCULAR | Status: AC
  Administered 2021-07-11: 100 ug via INTRAVENOUS
  Filled 2021-07-11: qty 2

## 2021-07-11 MED ORDER — DEXAMETHASONE SODIUM PHOSPHATE 10 MG/ML IJ SOLN
INTRAMUSCULAR | Status: DC | PRN
  Administered 2021-07-11: 8 mg via INTRAVENOUS

## 2021-07-11 MED ORDER — MIDAZOLAM HCL 2 MG/2ML IJ SOLN
INTRAMUSCULAR | Status: AC
  Filled 2021-07-11: qty 2

## 2021-07-11 MED ORDER — ONDANSETRON HCL 4 MG PO TABS: 4.0000 mg | ORAL_TABLET | Freq: Three times a day (TID) | ORAL | 0 refills | Status: DC | PRN

## 2021-07-11 MED ORDER — MIDAZOLAM HCL 2 MG/2ML IJ SOLN
1.0000 mg | Freq: Once | INTRAMUSCULAR | Status: AC
  Administered 2021-07-11: 2 mg via INTRAVENOUS
  Filled 2021-07-11: qty 2

## 2021-07-11 MED ORDER — OXYCODONE-ACETAMINOPHEN 5-325 MG PO TABS: 1.0000 | ORAL_TABLET | ORAL | 0 refills | Status: DC | PRN

## 2021-07-11 MED ORDER — MELOXICAM 15 MG PO TABS: 15.0000 mg | ORAL_TABLET | Freq: Every day | ORAL | 1 refills | Status: DC

## 2021-07-11 MED ORDER — BUPIVACAINE HCL (PF) 0.5 % IJ SOLN
INTRAMUSCULAR | Status: DC | PRN
  Administered 2021-07-11: 20 mL via PERINEURAL

## 2021-07-11 MED ORDER — CEFAZOLIN SODIUM-DEXTROSE 2-4 GM/100ML-% IV SOLN
2.0000 g | INTRAVENOUS | Status: AC
  Administered 2021-07-11: 2 g via INTRAVENOUS
  Filled 2021-07-11: qty 100

## 2021-07-11 MED ORDER — FENTANYL CITRATE (PF) 100 MCG/2ML IJ SOLN
25.0000 ug | INTRAMUSCULAR | Status: DC | PRN
  Administered 2021-07-11: 50 ug via INTRAVENOUS

## 2021-07-11 MED ORDER — OXYCODONE HCL 5 MG/5ML PO SOLN: 5.0000 mg | Freq: Once | ORAL | Status: DC | PRN

## 2021-07-11 MED ORDER — CHLORHEXIDINE GLUCONATE 0.12 % MT SOLN
15.0000 mL | Freq: Once | OROMUCOSAL | Status: AC
  Administered 2021-07-11: 15 mL via OROMUCOSAL

## 2021-07-11 MED ORDER — ONDANSETRON HCL 4 MG/2ML IJ SOLN: 4.0000 mg | Freq: Once | INTRAMUSCULAR | Status: DC | PRN

## 2021-07-11 MED ORDER — PROPOFOL 10 MG/ML IV BOLUS
INTRAVENOUS | Status: DC | PRN
  Administered 2021-07-11: 200 mg via INTRAVENOUS

## 2021-07-11 MED ORDER — DEXAMETHASONE SODIUM PHOSPHATE 10 MG/ML IJ SOLN
INTRAMUSCULAR | Status: AC
  Filled 2021-07-11: qty 1

## 2021-07-11 MED ORDER — CYCLOBENZAPRINE HCL 10 MG PO TABS: 10.0000 mg | ORAL_TABLET | Freq: Three times a day (TID) | ORAL | 1 refills | Status: DC | PRN

## 2021-07-11 MED ORDER — LIDOCAINE 2% (20 MG/ML) 5 ML SYRINGE
INTRAMUSCULAR | Status: DC | PRN
Start: 2021-07-11 — End: 2021-07-11
  Administered 2021-07-11: 20 mg via INTRAVENOUS

## 2021-07-11 MED ORDER — ROCURONIUM BROMIDE 10 MG/ML (PF) SYRINGE
PREFILLED_SYRINGE | INTRAVENOUS | Status: AC
  Filled 2021-07-11: qty 10

## 2021-07-11 MED ORDER — ORAL CARE MOUTH RINSE
15.0000 mL | Freq: Once | OROMUCOSAL | Status: AC
Start: 2021-07-11 — End: 2021-07-11

## 2021-07-11 MED ORDER — PROPOFOL 10 MG/ML IV BOLUS
INTRAVENOUS | Status: AC
  Filled 2021-07-11: qty 20

## 2021-07-11 SURGICAL SUPPLY — 58 items
ANCH SUT 2 FBRTK BLU WHT (Anchor) ×4 IMPLANT
ANCHOR SUT FBRTK 2 WIRE (Anchor) ×4 IMPLANT
BIT DRILL FLEX 1.7 SHR FBRTK (DRILL) IMPLANT
BLADE SURG SZ11 CARB STEEL (BLADE) ×2 IMPLANT
BOOTIES KNEE HIGH SLOAN (MISCELLANEOUS) ×4 IMPLANT
CANISTER SUCT LVC 12 LTR MEDI- (MISCELLANEOUS) ×2 IMPLANT
CANNULA ACUFLEX KIT 5X76 (CANNULA) ×2 IMPLANT
CANNULA DRILOCK 5.0X75 (CANNULA) ×4 IMPLANT
DISSECTOR  3.8MM X 13CM (MISCELLANEOUS) ×4
DISSECTOR 3.8MM X 13CM (MISCELLANEOUS) ×1 IMPLANT
DRAPE INCISE IOBAN 66X45 STRL (DRAPES) ×2 IMPLANT
DRAPE ORTHO SPLIT 77X108 STRL (DRAPES) ×4
DRAPE STERI 35X30 U-POUCH (DRAPES) IMPLANT
DRAPE SURG 17X11 SM STRL (DRAPES) ×2 IMPLANT
DRAPE SURG ORHT 6 SPLT 77X108 (DRAPES) ×2 IMPLANT
DRAPE U-SHAPE 47X51 STRL (DRAPES) IMPLANT
DRILL FLEX 1.7 SHARP OBT FBRTK (DRILL) ×2
DRSG PAD ABDOMINAL 8X10 ST (GAUZE/BANDAGES/DRESSINGS) ×4 IMPLANT
DURAPREP 26ML APPLICATOR (WOUND CARE) ×4 IMPLANT
GAUZE SPONGE 4X4 12PLY STRL (GAUZE/BANDAGES/DRESSINGS) ×2 IMPLANT
GLOVE SRG 8 PF TXTR STRL LF DI (GLOVE) ×1 IMPLANT
GLOVE SURG ENC MOIS LTX SZ7 (GLOVE) ×2 IMPLANT
GLOVE SURG ENC MOIS LTX SZ7.5 (GLOVE) ×2 IMPLANT
GLOVE SURG UNDER POLY LF SZ7 (GLOVE) ×2 IMPLANT
GLOVE SURG UNDER POLY LF SZ8 (GLOVE) ×2
GOWN STRL REUS W/ TWL XL LVL3 (GOWN DISPOSABLE) ×2 IMPLANT
GOWN STRL REUS W/TWL XL LVL3 (GOWN DISPOSABLE) ×4
KIT BASIN OR (CUSTOM PROCEDURE TRAY) ×2 IMPLANT
KIT CVD SPEAR FBRTK 1.8 DRILL (KITS) ×1 IMPLANT
KIT STR SPEAR 1.8 FBRTK DISP (KITS) ×1 IMPLANT
KIT TURNOVER KIT A (KITS) ×2 IMPLANT
LASSO 45 DEG CVD LEFT (SUTURE) ×1 IMPLANT
MANIFOLD NEPTUNE II (INSTRUMENTS) ×2 IMPLANT
NDL SPNL 18GX3.5 QUINCKE PK (NEEDLE) ×1 IMPLANT
NDL SUT 6 .5 CRC .975X.05 MAYO (NEEDLE) IMPLANT
NEEDLE MAYO TAPER (NEEDLE)
NEEDLE SPNL 18GX3.5 QUINCKE PK (NEEDLE) ×2 IMPLANT
NS IRRIG 1000ML POUR BTL (IV SOLUTION) ×2 IMPLANT
PACK SHOULDER (CUSTOM PROCEDURE TRAY) ×2 IMPLANT
PAD ARMBOARD 7.5X6 YLW CONV (MISCELLANEOUS) ×4 IMPLANT
SLEEVE ARM SUSPENSION SYSTEM (MISCELLANEOUS) ×2 IMPLANT
SLING ARM FOAM STRAP LRG (SOFTGOODS) IMPLANT
SLING ARM FOAM STRAP MED (SOFTGOODS) IMPLANT
SLING ARM IMMOBILIZER LRG (SOFTGOODS) IMPLANT
SLING S3 LATERAL DISP (MISCELLANEOUS) ×2 IMPLANT
SPONGE T-LAP 4X18 ~~LOC~~+RFID (SPONGE) IMPLANT
STRIP CLOSURE SKIN 1/2X4 (GAUZE/BANDAGES/DRESSINGS) ×2 IMPLANT
SUT LASSO 45 DEG R (SUTURE) IMPLANT
SUT MNCRL AB 3-0 PS2 18 (SUTURE) ×2 IMPLANT
SUT MNCRL AB 4-0 PS2 18 (SUTURE) IMPLANT
SUT PDS AB 1 CT  36 (SUTURE) ×4
SUT PDS AB 1 CT 36 (SUTURE) ×2 IMPLANT
SYR 20ML LL LF (SYRINGE) IMPLANT
TAPE PAPER 3X10 WHT MICROPORE (GAUZE/BANDAGES/DRESSINGS) ×2 IMPLANT
TOWEL OR 17X26 10 PK STRL BLUE (TOWEL DISPOSABLE) ×2 IMPLANT
TOWEL OR NON WOVEN STRL DISP B (DISPOSABLE) ×2 IMPLANT
TUBING ARTHROSCOPY IRRIG 16FT (MISCELLANEOUS) ×2 IMPLANT
WATER STERILE IRR 1000ML POUR (IV SOLUTION) ×2 IMPLANT

## 2021-07-11 NOTE — Op Note (Signed)
07/11/2021  2:36 PM  PATIENT:   Micheal Zuniga  40 y.o. male  PRE-OPERATIVE DIAGNOSIS:  Left shoulder recurrent instability  POST-OPERATIVE DIAGNOSIS: Same  PROCEDURE:  1.  Left shoulder examination under anesthesia.  2.  Left shoulder: Joint diagnostic arthroscopy  3.  Arthroscopic revision Bankart repair  SURGEON:  Charnele Semple, Vania Rea. M.D.  ASSISTANTS: Ralene Bathe, PA-C  ANESTHESIA:   General endotracheal and interscalene block with Exparel  EBL: Minimal  SPECIMEN: None  Drains: None   PATIENT DISPOSITION:  PACU - hemodynamically stable.    PLAN OF CARE: Discharge to home after PACU  Brief history:  Mr. Micheal Zuniga is a 40 year old gentleman with a remote history of recurrent left shoulder instability for which I had performed an arthroscopic stabilization a number of years ago.  He had done very well until an unfortunate traumatic reinjury with repeat dislocation with now ongoing significant symptomatic instability.  He is brought to the operating room at this time for planned revision arthroscopic stabilization of the left shoulder.  Preoperatively I counseled the patient regarding treatment options as well as the potential risks versus benefits thereof.  Possible surgical complications were reviewed including the potential for bleeding, infection, neurovascular injury, persistent pain, loss of motion, recurrence of instability, and possibly for additional surgery.  He understands, and accepts, and agrees with our planned procedure.  Procedure in detail:  After undergoing routine preop evaluation the patient received prophylactic antibiotics and interscalene block with Exparel was established in the holding area by the anesthesia department.  Patient subsequently placed supine on the operating table and underwent the smooth induction of a general endotracheal anesthesia.  Turned to the right lateral decubitus position on the beanbag and appropriately padded and protected.   Left shoulder examination under anesthesia revealed marked anterior instability.  No obvious posterior instability.  Left arm was then suspended with the Arthrex traction device using combination of lateral and longitudinal traction and was sterilely prepped and draped in standard fashion.  Timeout was called.  A posterior portal was established into the glenohumeral joint and anterior portal established under direct visualization.  Articular surfaces were found to be generally in good condition but there was obvious deficiency of the anterior inferior glenoid margin with some chondromalacia at the site of the bone defect from recurrent instability.  Additionally there was a Hill-Sachs defect that that showed minimal impaction.  The scarified anterior inferior capsule labral tissues were elevated from their scar 5 position on the medial scapular neck down to the 6 o'clock position.  The anterior margin of the glenoid rim from 6:00 up to 930 was then created with a ball rasp and soft tissue was removed creating a bone bed for repair.  An accessory anterior inferior portal was then established through the upper border the subscapularis and a series of 4 Arthrex knotless suture tack suture anchors were then placed equidistant across the anterior inferior quadrant of the glenoid.  The suture limbs were all then shuttled through the anterior band of the inferior glenohumeral ligament and horizontal mattress pattern and then tied sequentially with a series of sliding/locking knots followed by multiple overhand throws and alternating posts.  This allowed excellent ray apposition of the capsular labral tissues to the margin of the glenoid with construct much to our satisfaction.  At this time we reevaluated the area of the Hill-Sachs defect and made an initial inspection to see whether a remplissage would be necessary and technically possible.  We made some initial positional portals  but ultimately found that the Hill-Sachs  defect tendon did not obviously require remplissage.  At this point final inspection and irrigation was then completed.  Fluid and incensed removed.  The portals were closed with Monocryl and a Steri-Strip.  A dry dressing taped at the left shoulder left arm was placed into a sling immobilizer and the patient was awakened, extubated, and taken to the recovery room in stable condition.  Ralene Bathe, PA-C was utilized as an Geophysicist/field seismologist throughout this case, essential for help with positioning the patient, positioning extremity, tissue manipulation, implantation of the prosthesis, suture management, wound closure, and intraoperative decision-making.  Senaida Lange MD   Contact # [phone]

## 2021-07-11 NOTE — Transfer of Care (Signed)
Immediate Anesthesia Transfer of Care Note  Patient: Micheal Zuniga  Procedure(s) Performed: Left shoulder arthroscopy, Bankart repair, remplissage (Left: Hip)  Patient Location: PACU  Anesthesia Type:General  Level of Consciousness: awake, alert  and oriented  Airway & Oxygen Therapy: Patient Spontanous Breathing and Patient connected to face mask oxygen  Post-op Assessment: Report given to RN, Post -op Vital signs reviewed and stable and Patient moving all extremities X 4  Post vital signs: Reviewed and stable  Last Vitals:  Vitals Value Taken Time  BP 133/93 07/11/21 1503  Temp    Pulse 70 07/11/21 1504  Resp 28 07/11/21 1504  SpO2 98 % 07/11/21 1504  Vitals shown include unvalidated device data.  Last Pain:  Vitals:   07/11/21 0929  TempSrc: Oral         Complications: No notable events documented.

## 2021-07-11 NOTE — Anesthesia Procedure Notes (Signed)
Procedure Name: Intubation Date/Time: 07/11/2021 12:05 PM Performed by: West Pugh, CRNA Pre-anesthesia Checklist: Patient identified, Emergency Drugs available, Suction available, Patient being monitored and Timeout performed Patient Re-evaluated:Patient Re-evaluated prior to induction Oxygen Delivery Method: Circle system utilized Preoxygenation: Pre-oxygenation with 100% oxygen Induction Type: IV induction Ventilation: Mask ventilation without difficulty Laryngoscope Size: Mac and 4 Grade View: Grade I Tube type: Oral Tube size: 7.5 mm Number of attempts: 1 Airway Equipment and Method: Stylet Placement Confirmation: ETT inserted through vocal cords under direct vision, positive ETCO2 and breath sounds checked- equal and bilateral Secured at: 22 cm Tube secured with: Tape Dental Injury: Teeth and Oropharynx as per pre-operative assessment
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
## 2021-07-11 NOTE — Anesthesia Preprocedure Evaluation (Signed)
Anesthesia Evaluation  Patient identified by MRN, date of birth, ID band Patient awake    Reviewed: Allergy & Precautions, NPO status , Patient's Chart, lab work & pertinent test results  Airway Mallampati: II  TM Distance: >3 FB Neck ROM: Full    Dental  (+) Teeth Intact, Dental Advisory Given   Pulmonary neg pulmonary ROS,    Pulmonary exam normal breath sounds clear to auscultation       Cardiovascular negative cardio ROS Normal cardiovascular exam Rhythm:Regular Rate:Normal     Neuro/Psych PSYCHIATRIC DISORDERS Anxiety PTSDnegative neurological ROS     GI/Hepatic negative GI ROS, Neg liver ROS,   Endo/Other  Obesity  Renal/GU negative Renal ROS  negative genitourinary   Musculoskeletal Left shoulder instability   Abdominal (+) + obese,   Peds  Hematology negative hematology ROS (+)   Anesthesia Other Findings   Reproductive/Obstetrics ED Genital herpes                             Anesthesia Physical Anesthesia Plan  ASA: 2  Anesthesia Plan: General   Post-op Pain Management:  Regional for Post-op pain   Induction: Intravenous  PONV Risk Score and Plan: 3 and Treatment may vary due to age or medical condition, Midazolam, Ondansetron and Dexamethasone  Airway Management Planned: Oral ETT  Additional Equipment:   Intra-op Plan:   Post-operative Plan: Extubation in OR  Informed Consent: I have reviewed the patients History and Physical, chart, labs and discussed the procedure including the risks, benefits and alternatives for the proposed anesthesia with the patient or authorized representative who has indicated his/her understanding and acceptance.     Dental advisory given  Plan Discussed with: CRNA and Anesthesiologist  Anesthesia Plan Comments:         Anesthesia Quick Evaluation

## 2021-07-11 NOTE — Anesthesia Procedure Notes (Signed)
Anesthesia Regional Block: Interscalene brachial plexus block   Pre-Anesthetic Checklist: , timeout performed,  Correct Patient, Correct Site, Correct Laterality,  Correct Procedure, Correct Position, site marked,  Risks and benefits discussed,  Surgical consent,  Pre-op evaluation,  At surgeon's request and post-op pain management  Laterality: Left  Prep: chloraprep       Needles:  Injection technique: Single-shot  Needle Type: Echogenic Stimulator Needle     Needle Length: 10cm  Needle Gauge: 21   Needle insertion depth: 6 cm   Additional Needles:   Procedures:,,,, ultrasound used (permanent image in chart),,   Motor weakness within 6 minutes.  Narrative:  Start time: 07/11/2021 11:15 AM End time: 07/11/2021 11:20 AM Injection made incrementally with aspirations every 5 mL.  Performed by: Personally  Anesthesiologist: Mal Amabile, MD  Additional Notes: Timeout performed. Patient sedated. Relevant anatomy ID'd using [place]. Incremental 2-59ml injection of LA with frequent aspiration. Patient tolerated procedure well.    Left Interscalene Block

## 2021-07-11 NOTE — Discharge Instructions (Signed)
   Micheal Zuniga. Supple, M.D., F.A.A.O.S. Orthopaedic Surgery Specializing in Arthroscopic and Reconstructive Surgery of the Shoulder [phone] [address] [place], [place] [address] - Fax [phone]  POST-OP SHOULDER ARTHROSCOPIC ROTATOR CUFF AND/OR LABRAL REPAIR INSTRUCTIONS  1. Call the office at [phone] to schedule your first post-op appointment 7-10 days from the date of your surgery.  2. Leave the steri-strips in place over your incisions when performing dressing changes and showering. You may remove your dressings and begin showering 72 hours from surgery. You can expect drainage that is clear to bloody in nature that occasionally will soak through your dressings. If this occurs go ahead and perform a dressing change. The drainage should lessen daily and when there is no drainage from your incisions feel free to go without a dressing.  3. Wear your sling/immobilizer at all times except to occasionally let your arm dangle by your side to stretch your elbow. You also need to sleep in your sling immobilizer until instructed otherwise.  4. Range of motion to your elbow, wrist, and hand are encouraged 3-5 times daily. Exercise to your hand and fingers helps to reduce swelling you may experience.  5. Utilize ice machine to the shoulder as instructed  6. You may one-armed drive when safely off of narcotics and muscle relaxants. You may use your hand that is in the sling to support the steering wheel only. However, should it be your right arm that is in the sling it is not to be used for gear shifting in a manual transmission.  7. Pain control following an exparel block  To help control your post-operative pain you received a nerve block  performed with Exparel which is a long acting anesthetic (numbing agent) which can provide pain relief and sensations of numbness (and relief of pain) in the operative shoulder and arm for up to 3 days. Sometimes it provides mixed relief,  meaning you may still have numbness in certain areas of the arm but can still be able to move  parts of that arm, hand, and fingers. We recommend that your prescribed pain medications  be used as needed. We do not feel it is necessary to "pre medicate" and "stay ahead" of pain.  Taking narcotic pain medications when you are not having any pain can lead to unnecessary and potentially dangerous side effects.    8. Pain medications can produce constipation along with their use. If you experience this, the use of an over the counter stool softener or laxative daily is recommended.   9. For additional questions or concerns, please do not hesitate to call the office. If after hours there is an answering service to forward your concerns to the physician on call.  POST-OP EXERCISES   No exercises yet.!!!  OK TO ALLOW ARM TO DANGLE BY SIDE FOR SHOWERS AND HYGIENE AND TO STRETCH ELBOW

## 2021-07-11 NOTE — Progress Notes (Signed)
Assisted Dr. Foster with left, ultrasound guided, interscalene  block. Side rails up, monitors on throughout procedure. See vital signs in flow sheet. Tolerated Procedure well.  

## 2021-07-11 NOTE — Anesthesia Postprocedure Evaluation (Signed)
Anesthesia Post Note  Patient: Micheal Zuniga  Procedure(s) Performed: Left shoulder arthroscopy, Bankart repair, remplissage (Left: Hip)     Patient location during evaluation: PACU Anesthesia Type: General Level of consciousness: awake and alert and oriented Pain management: pain level controlled Vital Signs Assessment: post-procedure vital signs reviewed and stable Respiratory status: spontaneous breathing, nonlabored ventilation and respiratory function stable Cardiovascular status: blood pressure returned to baseline and stable Postop Assessment: no apparent nausea or vomiting Anesthetic complications: no   No notable events documented.  Last Vitals:  Vitals:   07/11/21 1545 07/11/21 1600  BP: 121/86 125/87  Pulse: 67 62  Resp: 14 14  Temp: (!) 35.7 C (!) 36.1 C  SpO2: 95% 95%    Last Pain:  Vitals:   07/11/21 1600  TempSrc:   PainSc: 4                  Sonjia Wilcoxson A.

## 2021-07-18 ENCOUNTER — Encounter (HOSPITAL_COMMUNITY): Payer: Self-pay | Admitting: Orthopedic Surgery

## 2022-04-01 ENCOUNTER — Inpatient Hospital Stay (HOSPITAL_BASED_OUTPATIENT_CLINIC_OR_DEPARTMENT_OTHER)
Admission: EM | Admit: 2022-04-01 | Discharge: 2022-04-07 | DRG: 390 | Disposition: A | Discharge status: Home / Self Care | Payer: 59 | Attending: Internal Medicine | Admitting: Internal Medicine

## 2022-04-01 ENCOUNTER — Encounter (HOSPITAL_BASED_OUTPATIENT_CLINIC_OR_DEPARTMENT_OTHER): Payer: Self-pay

## 2022-04-01 ENCOUNTER — Other Ambulatory Visit: Payer: Self-pay

## 2022-04-01 ENCOUNTER — Emergency Department (HOSPITAL_BASED_OUTPATIENT_CLINIC_OR_DEPARTMENT_OTHER): Payer: 59

## 2022-04-01 DIAGNOSIS — R109 Unspecified abdominal pain: Secondary | ICD-10-CM | POA: Diagnosis present

## 2022-04-01 DIAGNOSIS — Z23 Encounter for immunization: Secondary | ICD-10-CM

## 2022-04-01 DIAGNOSIS — K56609 Unspecified intestinal obstruction, unspecified as to partial versus complete obstruction: Secondary | ICD-10-CM | POA: Diagnosis not present

## 2022-04-01 DIAGNOSIS — R7309 Other abnormal glucose: Secondary | ICD-10-CM | POA: Diagnosis present

## 2022-04-01 DIAGNOSIS — R112 Nausea with vomiting, unspecified: Secondary | ICD-10-CM | POA: Diagnosis present

## 2022-04-01 DIAGNOSIS — R739 Hyperglycemia, unspecified: Secondary | ICD-10-CM | POA: Diagnosis present

## 2022-04-01 DIAGNOSIS — Z79899 Other long term (current) drug therapy: Secondary | ICD-10-CM

## 2022-04-01 DIAGNOSIS — G4700 Insomnia, unspecified: Secondary | ICD-10-CM | POA: Diagnosis present

## 2022-04-01 LAB — CBC WITH DIFFERENTIAL/PLATELET
Abs Immature Granulocytes: 0.02 10*3/uL (ref 0.00–0.07)
Basophils Absolute: 0 10*3/uL (ref 0.0–0.1)
Basophils Relative: 0 %
Eosinophils Absolute: 0.1 10*3/uL (ref 0.0–0.5)
Eosinophils Relative: 1 %
HCT: 44.4 % (ref 39.0–52.0)
Hemoglobin: 14.6 g/dL (ref 13.0–17.0)
Immature Granulocytes: 0 %
Lymphocytes Relative: 31 %
Lymphs Abs: 1.8 10*3/uL (ref 0.7–4.0)
MCH: 27.2 pg (ref 26.0–34.0)
MCHC: 32.9 g/dL (ref 30.0–36.0)
MCV: 82.7 fL (ref 80.0–100.0)
Monocytes Absolute: 0.6 10*3/uL (ref 0.1–1.0)
Monocytes Relative: 11 %
Neutro Abs: 3.4 10*3/uL (ref 1.7–7.7)
Neutrophils Relative %: 57 %
Platelets: 267 10*3/uL (ref 150–400)
RBC: 5.37 MIL/uL (ref 4.22–5.81)
RDW: 13.3 % (ref 11.5–15.5)
WBC: 6 10*3/uL (ref 4.0–10.5)
nRBC: 0 % (ref 0.0–0.2)

## 2022-04-01 LAB — COMPREHENSIVE METABOLIC PANEL
ALT: 30 U/L (ref 0–44)
AST: 25 U/L (ref 15–41)
Albumin: 4.4 g/dL (ref 3.5–5.0)
Alkaline Phosphatase: 42 U/L (ref 38–126)
Anion gap: 10 (ref 5–15)
BUN: 13 mg/dL (ref 6–20)
CO2: 26 mmol/L (ref 22–32)
Calcium: 9.4 mg/dL (ref 8.9–10.3)
Chloride: 103 mmol/L (ref 98–111)
Creatinine, Ser: 1.02 mg/dL (ref 0.61–1.24)
GFR, Estimated: >60 mL/min (ref >60)
Glucose, Bld: 105 mg/dL — ABNORMAL HIGH (ref 70–99)
Potassium: 3.8 mmol/L (ref 3.5–5.1)
Sodium: 139 mmol/L (ref 135–145)
Total Bilirubin: 0.6 mg/dL (ref 0.3–1.2)
Total Protein: 7.9 g/dL (ref 6.5–8.1)

## 2022-04-01 LAB — LIPASE, BLOOD: Lipase: 30 U/L (ref 11–51)

## 2022-04-01 MED ORDER — IOHEXOL 300 MG/ML  SOLN
100.0000 mL | Freq: Once | INTRAMUSCULAR | Status: AC | PRN
  Administered 2022-04-01: 100 mL via INTRAVENOUS

## 2022-04-01 MED ORDER — ONDANSETRON HCL 4 MG/2ML IJ SOLN
4.0000 mg | Freq: Once | INTRAMUSCULAR | Status: AC
  Administered 2022-04-01: 4 mg via INTRAVENOUS
  Filled 2022-04-01: qty 2

## 2022-04-01 MED ORDER — FENTANYL CITRATE PF 50 MCG/ML IJ SOSY
50.0000 ug | PREFILLED_SYRINGE | Freq: Once | INTRAMUSCULAR | Status: AC
  Administered 2022-04-01: 50 ug via INTRAVENOUS
  Filled 2022-04-01: qty 1

## 2022-04-01 MED ORDER — DIAZEPAM 5 MG/ML IJ SOLN
2.5000 mg | Freq: Once | INTRAMUSCULAR | Status: AC
  Administered 2022-04-01: 2.5 mg via INTRAVENOUS
  Filled 2022-04-01: qty 2

## 2022-04-01 MED ORDER — SODIUM CHLORIDE 0.9 % IV SOLN: Freq: Once | INTRAVENOUS | Status: AC

## 2022-04-01 MED ORDER — SODIUM CHLORIDE 0.9 % IV BOLUS
1000.0000 mL | Freq: Once | INTRAVENOUS | Status: AC
  Administered 2022-04-01: 1000 mL via INTRAVENOUS

## 2022-04-01 NOTE — ED Notes (Signed)
Pt gives verbal consent for transfer prior to administration of valium. ?

## 2022-04-01 NOTE — ED Notes (Signed)
Pt sts pain is better but still there. Rates 7/10 ?

## 2022-04-01 NOTE — ED Triage Notes (Signed)
Pt reports upper abd pain onset Sunday associated with n/v/d and lack of appetite, SOB and reports abd swelling. ?

## 2022-04-01 NOTE — ED Provider Notes (Signed)
?[facility] EMERGENCY DEPARTMENT ?Provider Note ? ? ?CSN: [id number] ?Arrival date & time: 04/01/22  2050 ? ?  ? ?History ? ?Chief Complaint  ?Patient presents with  ? Abdominal Pain  ? ? ?Micheal Zuniga is a 41 y.o. male. ? ?The history is provided by the patient.  ?Abdominal Pain ?Pain location:  Generalized ?Pain quality: bloating   ?Pain radiates to:  Does not radiate ?Pain severity:  Moderate ?Onset quality:  Gradual ?Duration:  3 days ?Timing:  Constant ?Progression:  Worsening ?Chronicity:  New ?Context: not sick contacts   ?Relieved by:  Nothing ?Worsened by:  Nothing ?Associated symptoms: diarrhea, nausea and vomiting   ?Associated symptoms: no anorexia, no belching, no chest pain, no chills, no constipation, no cough, no dysuria, no fatigue, no fever, no flatus, no hematuria, no melena, no shortness of breath and no sore throat   ?Risk factors: has not had multiple surgeries   ? ?  ? ?Home Medications ?Prior to Admission medications   ?Medication Sig Start Date End Date Taking? Authorizing Provider  ?cyclobenzaprine (FLEXERIL) 10 MG tablet Take 1 tablet (10 mg total) by mouth 3 (three) times daily as needed for muscle spasms. 07/11/21   Shuford, Olivia Mackie, PA-C  ?ibuprofen (ADVIL) 200 MG tablet Take 400-800 mg by mouth every 6 (six) hours as needed for moderate pain.    [provider]  ?meloxicam (MOBIC) 15 MG tablet Take 1 tablet (15 mg total) by mouth daily. 07/11/21   Shuford, Olivia Mackie, PA-C  ?Multiple Vitamin (MULTIVITAMIN) tablet Take 1 tablet by mouth daily.    [provider]  ?ondansetron (ZOFRAN) 4 MG tablet Take 1 tablet (4 mg total) by mouth every 8 (eight) hours as needed for nausea or vomiting. 07/11/21   Shuford, Olivia Mackie, PA-C  ?OVER THE COUNTER MEDICATION Take 1 capsule by mouth daily. Blood Flow Support    [provider]  ?oxyCODONE-acetaminophen (PERCOCET) 5-325 MG tablet Take 1 tablet by mouth every 4 (four) hours as needed (max 6 q). 07/11/21   Shuford, Olivia Mackie,  PA-C  ?   ? ?Allergies    ?Patient has no known allergies.   ? ?Review of Systems   ?Review of Systems  ?Constitutional:  Negative for chills, fatigue and fever.  ?HENT:  Negative for sore throat.   ?Respiratory:  Negative for cough and shortness of breath.   ?Cardiovascular:  Negative for chest pain.  ?Gastrointestinal:  Positive for abdominal pain, diarrhea, nausea and vomiting. Negative for anorexia, constipation, flatus and melena.  ?Genitourinary:  Negative for dysuria and hematuria.  ? ?Physical Exam ?Updated Vital Signs ? ?ED Triage Vitals [04/01/22 2057]  ?Enc Vitals Group  ?   BP (!) 167/95  ?   Pulse Rate 72  ?   Resp 18  ?   Temp 98.6 ?F (37 ?C)  ?   Temp Source Oral  ?   SpO2 99 %  ?   Weight 128 lb (58.1 kg)  ?   Height 6' (1.829 m)  ?   Head Circumference   ?   Peak Flow   ?   Pain Score 10  ?   Pain Loc   ?   Pain Edu?   ?   Excl. in [place]?   ? ? ?Physical Exam ?Vitals and nursing note reviewed.  ?Constitutional:   ?   General: He is not in acute distress. ?   Appearance: He is well-developed. He is not ill-appearing.  ?HENT:  ?   Head:  Normocephalic and atraumatic.  ?Eyes:  ?   Extraocular Movements: Extraocular movements intact.  ?   Conjunctiva/sclera: Conjunctivae normal.  ?   Pupils: Pupils are equal, round, and reactive to light.  ?Cardiovascular:  ?   Rate and Rhythm: Normal rate and regular rhythm.  ?   Heart sounds: Normal heart sounds. No murmur heard. ?Pulmonary:  ?   Effort: Pulmonary effort is normal. No respiratory distress.  ?   Breath sounds: Normal breath sounds.  ?Abdominal:  ?   General: There is distension.  ?   Palpations: Abdomen is soft.  ?   Tenderness: There is generalized abdominal tenderness.  ?Musculoskeletal:     ?   General: No swelling.  ?   Cervical back: Neck supple.  ?Skin: ?   General: Skin is warm and dry.  ?   Capillary Refill: Capillary refill takes less than 2 seconds.  ?Neurological:  ?   General: No focal deficit present.  ?   Mental Status: He is alert.   ?Psychiatric:     ?   Mood and Affect: Mood normal.  ? ? ?ED Results / Procedures / Treatments   ?Labs ?(all labs ordered are listed, but only abnormal results are displayed) ?Labs Reviewed  ?COMPREHENSIVE METABOLIC PANEL - Abnormal; Notable for the following components:  ?    Result Value  ? Glucose, Bld 105 (*)   ? All other components within normal limits  ?CBC WITH DIFFERENTIAL/PLATELET  ?LIPASE, BLOOD  ?URINALYSIS, ROUTINE W REFLEX MICROSCOPIC  ? ? ?EKG ?None ? ?Radiology ?CT ABDOMEN PELVIS W CONTRAST ? ?Result Date: 04/01/2022 ?CLINICAL DATA:  Nausea vomiting lack of appetite EXAM: CT ABDOMEN AND PELVIS WITH CONTRAST TECHNIQUE: Multidetector CT imaging of the abdomen and pelvis was performed using the standard protocol following bolus administration of intravenous contrast. RADIATION DOSE REDUCTION: This exam was performed according to the departmental dose-optimization program which includes automated exposure control, adjustment of the mA and/or kV according to patient size and/or use of iterative reconstruction technique. CONTRAST:  126mL OMNIPAQUE IOHEXOL 300 MG/ML  SOLN COMPARISON:  CT report 02/07/2015, CT pelvis 03/19/2018 FINDINGS: Lower chest: Lung bases demonstrate no acute consolidation or effusion. Normal cardiac size. Hepatobiliary: No focal liver abnormality is seen. No gallstones, gallbladder wall thickening, or biliary dilatation. Pancreas: Unremarkable. No pancreatic ductal dilatation or surrounding inflammatory changes. Spleen: Normal in size without focal abnormality. Adrenals/Urinary Tract: Adrenal glands are unremarkable. Kidneys are normal, without renal calculi, focal lesion, or hydronephrosis. Bladder is unremarkable. Stomach/Bowel: Moderate fluid distension of the stomach. Multiple dilated fluid-filled loops of mid small bowel measuring up to 4.8 cm with gradual transition to decompressed distal small bowel in the right mid abdomen, suspicious for bowel obstruction. Terminal ileum and  colon are relatively decompressed. No acute bowel wall thickening. Vascular/Lymphatic: No significant vascular findings are present. No enlarged abdominal or pelvic lymph nodes. Mild atherosclerosis. Reproductive: Prostate is unremarkable. Other: Negative for pelvic effusion or free air. Musculoskeletal: No acute or significant osseous findings. IMPRESSION: 1. Findings suspicious for small bowel obstruction with probable transition zone in the distal small bowel in the right mid abdomen. No free air or intramural air. Electronically Signed   By: Donavan Foil M.D.   On: 04/01/2022 22:06   ? ?Procedures ?Procedures  ? ? ?Medications Ordered in ED ?Medications  ?sodium chloride 0.9 % bolus 1,000 mL (0 mLs Intravenous Stopped 04/01/22 2238)  ?fentaNYL (SUBLIMAZE) injection 50 mcg (50 mcg Intravenous Given 04/01/22 2112)  ?ondansetron (ZOFRAN) injection 4 mg (  4 mg Intravenous Given 04/01/22 2112)  ?iohexol (OMNIPAQUE) 300 MG/ML solution 100 mL (100 mLs Intravenous Contrast Given 04/01/22 2135)  ?diazepam (VALIUM) injection 2.5 mg (2.5 mg Intravenous Given 04/01/22 2237)  ? ? ?ED Course/ Medical Decision Making/ A&P ?  ?                        ?Medical Decision Making ?Amount and/or Complexity of Data Reviewed ?Labs: ordered. ?Radiology: ordered. ? ?Risk ?Prescription drug management. ?Decision regarding hospitalization. ? ? ?Niki A Dorey is here with abdominal pain, nausea, vomiting, diarrhea.  No significant medical history.  Symptoms for the last 3 days.  Has been able to eat or drink much today.  Feels very bloated in his stomach.  Mostly in his epigastric region but he is tender diffusely.  He appears uncomfortable.  We will get a CBC, CMP, lipase, CT scan abdomen and pelvis.  Will give IV fluids, IV fentanyl, IV Zofran.  Differential diagnosis includes pancreatitis versus colitis versus cholecystitis versus bowel obstruction. ? ?Per my review and interpretation the labs there is no significant anemia, electrolyte  abnormality, kidney injury, leukocytosis.  CT scan showed findings suspicious for small bowel obstruction with probable transition zone in the distal small bowel.  Talked with Dr. Windle Guard with general surgery.  Will place N

## 2022-04-01 NOTE — ED Notes (Signed)
Patient transported to CT 

## 2022-04-02 ENCOUNTER — Inpatient Hospital Stay (HOSPITAL_COMMUNITY): Payer: 59

## 2022-04-02 ENCOUNTER — Encounter (HOSPITAL_COMMUNITY): Payer: Self-pay | Admitting: Internal Medicine

## 2022-04-02 DIAGNOSIS — R7309 Other abnormal glucose: Secondary | ICD-10-CM | POA: Diagnosis not present

## 2022-04-02 DIAGNOSIS — Z79899 Other long term (current) drug therapy: Secondary | ICD-10-CM | POA: Diagnosis not present

## 2022-04-02 DIAGNOSIS — Z23 Encounter for immunization: Secondary | ICD-10-CM | POA: Diagnosis not present

## 2022-04-02 DIAGNOSIS — K56609 Unspecified intestinal obstruction, unspecified as to partial versus complete obstruction: Secondary | ICD-10-CM | POA: Diagnosis present

## 2022-04-02 DIAGNOSIS — G4700 Insomnia, unspecified: Secondary | ICD-10-CM | POA: Diagnosis present

## 2022-04-02 DIAGNOSIS — R1013 Epigastric pain: Secondary | ICD-10-CM | POA: Diagnosis not present

## 2022-04-02 DIAGNOSIS — R739 Hyperglycemia, unspecified: Secondary | ICD-10-CM | POA: Diagnosis present

## 2022-04-02 DIAGNOSIS — G4709 Other insomnia: Secondary | ICD-10-CM | POA: Diagnosis not present

## 2022-04-02 LAB — COMPREHENSIVE METABOLIC PANEL
ALT: 27 U/L (ref 0–44)
AST: 21 U/L (ref 15–41)
Albumin: 3.7 g/dL (ref 3.5–5.0)
Alkaline Phosphatase: 36 U/L — ABNORMAL LOW (ref 38–126)
Anion gap: 6 (ref 5–15)
BUN: 12 mg/dL (ref 6–20)
CO2: 30 mmol/L (ref 22–32)
Calcium: 8.9 mg/dL (ref 8.9–10.3)
Chloride: 104 mmol/L (ref 98–111)
Creatinine, Ser: 1.07 mg/dL (ref 0.61–1.24)
GFR, Estimated: >60 mL/min (ref >60)
Glucose, Bld: 100 mg/dL — ABNORMAL HIGH (ref 70–99)
Potassium: 4.7 mmol/L (ref 3.5–5.1)
Sodium: 140 mmol/L (ref 135–145)
Total Bilirubin: 0.7 mg/dL (ref 0.3–1.2)
Total Protein: 6.7 g/dL (ref 6.5–8.1)

## 2022-04-02 LAB — CBC WITH DIFFERENTIAL/PLATELET
Abs Immature Granulocytes: 0.01 10*3/uL (ref 0.00–0.07)
Basophils Absolute: 0 10*3/uL (ref 0.0–0.1)
Basophils Relative: 0 %
Eosinophils Absolute: 0.1 10*3/uL (ref 0.0–0.5)
Eosinophils Relative: 2 %
HCT: 42.1 % (ref 39.0–52.0)
Hemoglobin: 13.2 g/dL (ref 13.0–17.0)
Immature Granulocytes: 0 %
Lymphocytes Relative: 35 %
Lymphs Abs: 1.7 10*3/uL (ref 0.7–4.0)
MCH: 27.2 pg (ref 26.0–34.0)
MCHC: 31.4 g/dL (ref 30.0–36.0)
MCV: 86.8 fL (ref 80.0–100.0)
Monocytes Absolute: 0.6 10*3/uL (ref 0.1–1.0)
Monocytes Relative: 12 %
Neutro Abs: 2.5 10*3/uL (ref 1.7–7.7)
Neutrophils Relative %: 51 %
Platelets: 204 10*3/uL (ref 150–400)
RBC: 4.85 MIL/uL (ref 4.22–5.81)
RDW: 13.4 % (ref 11.5–15.5)
WBC: 5 10*3/uL (ref 4.0–10.5)
nRBC: 0 % (ref 0.0–0.2)

## 2022-04-02 LAB — URINALYSIS, ROUTINE W REFLEX MICROSCOPIC
Bilirubin Urine: NEGATIVE
Glucose, UA: NEGATIVE mg/dL
Hgb urine dipstick: NEGATIVE
Ketones, ur: NEGATIVE mg/dL
Leukocytes,Ua: NEGATIVE
Nitrite: NEGATIVE
Protein, ur: NEGATIVE mg/dL
Specific Gravity, Urine: >1.046 — ABNORMAL HIGH (ref 1.005–1.030)
pH: 5 (ref 5.0–8.0)

## 2022-04-02 LAB — CREATININE, SERUM
Creatinine, Ser: 0.99 mg/dL (ref 0.61–1.24)
GFR, Estimated: >60 mL/min (ref >60)

## 2022-04-02 LAB — CBC
HCT: 41.2 % (ref 39.0–52.0)
Hemoglobin: 13.3 g/dL (ref 13.0–17.0)
MCH: 27.8 pg (ref 26.0–34.0)
MCHC: 32.3 g/dL (ref 30.0–36.0)
MCV: 86 fL (ref 80.0–100.0)
Platelets: 205 10*3/uL (ref 150–400)
RBC: 4.79 MIL/uL (ref 4.22–5.81)
RDW: 13.2 % (ref 11.5–15.5)
WBC: 5.2 10*3/uL (ref 4.0–10.5)
nRBC: 0 % (ref 0.0–0.2)

## 2022-04-02 LAB — HIV ANTIBODY (ROUTINE TESTING W REFLEX): HIV Screen 4th Generation wRfx: NONREACTIVE

## 2022-04-02 LAB — PHOSPHORUS: Phosphorus: 4 mg/dL (ref 2.5–4.6)

## 2022-04-02 LAB — MAGNESIUM: Magnesium: 2.1 mg/dL (ref 1.7–2.4)

## 2022-04-02 MED ORDER — DIATRIZOATE MEGLUMINE & SODIUM 66-10 % PO SOLN
90.0000 mL | Freq: Once | ORAL | Status: AC
  Administered 2022-04-02: 90 mL via NASOGASTRIC
  Filled 2022-04-02: qty 90

## 2022-04-02 MED ORDER — LACTATED RINGERS IV SOLN: INTRAVENOUS | Status: AC

## 2022-04-02 MED ORDER — ENOXAPARIN SODIUM 40 MG/0.4ML IJ SOSY: 40.0000 mg | PREFILLED_SYRINGE | INTRAMUSCULAR | Status: DC

## 2022-04-02 MED ORDER — HEPARIN SODIUM (PORCINE) 5000 UNIT/ML IJ SOLN
5000.0000 [IU] | Freq: Three times a day (TID) | INTRAMUSCULAR | Status: DC
  Administered 2022-04-03 – 2022-04-07 (×14): 5000 [IU] via SUBCUTANEOUS
  Filled 2022-04-02 (×14): qty 1

## 2022-04-02 MED ORDER — HYDROMORPHONE HCL 1 MG/ML IJ SOLN
0.5000 mg | INTRAMUSCULAR | Status: DC | PRN
  Administered 2022-04-02 – 2022-04-04 (×12): 0.5 mg via INTRAVENOUS
  Filled 2022-04-02 (×13): qty 0.5

## 2022-04-02 MED ORDER — FENTANYL CITRATE PF 50 MCG/ML IJ SOSY
100.0000 ug | PREFILLED_SYRINGE | INTRAMUSCULAR | Status: DC | PRN
  Administered 2022-04-02: 100 ug via INTRAVENOUS
  Filled 2022-04-02: qty 2

## 2022-04-02 NOTE — ED Notes (Signed)
Weight corrected. Pt sts he weighs approx 226 not 126 that was previously charted. ?
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
## 2022-04-03 ENCOUNTER — Inpatient Hospital Stay (HOSPITAL_COMMUNITY): Payer: 59

## 2022-04-03 DIAGNOSIS — K56609 Unspecified intestinal obstruction, unspecified as to partial versus complete obstruction: Secondary | ICD-10-CM | POA: Diagnosis not present

## 2022-04-03 LAB — COMPREHENSIVE METABOLIC PANEL
ALT: 28 U/L (ref 0–44)
AST: 20 U/L (ref 15–41)
Albumin: 3.9 g/dL (ref 3.5–5.0)
Alkaline Phosphatase: 40 U/L (ref 38–126)
Anion gap: 8 (ref 5–15)
BUN: 10 mg/dL (ref 6–20)
CO2: 27 mmol/L (ref 22–32)
Calcium: 8.9 mg/dL (ref 8.9–10.3)
Chloride: 102 mmol/L (ref 98–111)
Creatinine, Ser: 0.94 mg/dL (ref 0.61–1.24)
GFR, Estimated: >60 mL/min (ref >60)
Glucose, Bld: 84 mg/dL (ref 70–99)
Potassium: 3.7 mmol/L (ref 3.5–5.1)
Sodium: 137 mmol/L (ref 135–145)
Total Bilirubin: 1.1 mg/dL (ref 0.3–1.2)
Total Protein: 6.9 g/dL (ref 6.5–8.1)

## 2022-04-03 LAB — CBC WITH DIFFERENTIAL/PLATELET
Abs Immature Granulocytes: 0.02 10*3/uL (ref 0.00–0.07)
Basophils Absolute: 0 10*3/uL (ref 0.0–0.1)
Basophils Relative: 0 %
Eosinophils Absolute: 0.1 10*3/uL (ref 0.0–0.5)
Eosinophils Relative: 2 %
HCT: 43.3 % (ref 39.0–52.0)
Hemoglobin: 14.1 g/dL (ref 13.0–17.0)
Immature Granulocytes: 0 %
Lymphocytes Relative: 26 %
Lymphs Abs: 1.4 10*3/uL (ref 0.7–4.0)
MCH: 27.6 pg (ref 26.0–34.0)
MCHC: 32.6 g/dL (ref 30.0–36.0)
MCV: 84.7 fL (ref 80.0–100.0)
Monocytes Absolute: 0.6 10*3/uL (ref 0.1–1.0)
Monocytes Relative: 11 %
Neutro Abs: 3.3 10*3/uL (ref 1.7–7.7)
Neutrophils Relative %: 61 %
Platelets: 210 10*3/uL (ref 150–400)
RBC: 5.11 MIL/uL (ref 4.22–5.81)
RDW: 12.7 % (ref 11.5–15.5)
WBC: 5.3 10*3/uL (ref 4.0–10.5)
nRBC: 0 % (ref 0.0–0.2)

## 2022-04-03 LAB — MAGNESIUM: Magnesium: 2 mg/dL (ref 1.7–2.4)

## 2022-04-03 LAB — PHOSPHORUS: Phosphorus: 3.7 mg/dL (ref 2.5–4.6)

## 2022-04-03 NOTE — Progress Notes (Signed)
[facility] ?Progress Note ? ?   ?Subjective: ?CC:  ?No acute events overnight.  Denies flatus.  Reports one, small liquid stool that he states was clear, not a real bowel movement.  Ongoing mild intermittent abdominal pain.  ? ?NG with over 1800 cc per 24 hours ?Objective: ?Vital signs in last 24 hours: ?Temp:  [97.6 ?F (36.4 ?C)-98.7 ?F (37.1 ?C)] 98.7 ?F (37.1 ?C) (05/11 0449) ?Pulse Rate:  [55-71] 65 (05/11 0449) ?Resp:  [14-17] 17 (05/11 0449) ?BP: (123-142)/(72-94) 134/87 (05/11 0449) ?SpO2:  [99 %-100 %] 99 % (05/11 0449) ?Weight:  [99.5 kg] 99.5 kg (05/11 0459) ?Last BM Date : 04/01/22 ? ?Intake/Output from previous day: ?05/10 0701 - 05/11 0700 ?In: 637.4 [I.V.:637.4] ?Out: 1850 [Emesis/NG output:1850] ?Intake/Output this shift: ?Total I/O ?In: 0  ?Out: 150 [Emesis/NG output:150] ? ?PE: ?Gen:  Alert, NAD, pleasant ?Card:  Regular rate and rhythm, pedal pulses 2+ BL ?Pulm:  Normal effort, clear to auscultation bilaterally ?Abd: Soft, nondistended, tender to palpation in the epigastric region without peritonitis, no hernias, no organomegaly ?Skin: warm and dry, no rashes  ?Psych: A&Ox3  ? ?Lab Results:  ?Recent Labs  ?  04/02/22 ?0711 04/03/22 ?0451  ?WBC 5.0 5.3  ?HGB 13.2 14.1  ?HCT 42.1 43.3  ?PLT 204 210  ? ?BMET ?Recent Labs  ?  04/02/22 ?0711 04/03/22 ?0451  ?NA 140 137  ?K 4.7 3.7  ?CL 104 102  ?CO2 30 27  ?GLUCOSE 100* 84  ?BUN 12 10  ?CREATININE 1.07 0.94  ?CALCIUM 8.9 8.9  ? ?PT/INR ?No results for input(s): LABPROT, INR in the last 72 hours. ?CMP  ?   ?Component Value Date/Time  ? NA 137 04/03/2022 0451  ? K 3.7 04/03/2022 0451  ? CL 102 04/03/2022 0451  ? CO2 27 04/03/2022 0451  ? GLUCOSE 84 04/03/2022 0451  ? BUN 10 04/03/2022 0451  ? CREATININE 0.94 04/03/2022 0451  ? CALCIUM 8.9 04/03/2022 0451  ? PROT 6.9 04/03/2022 0451  ? ALBUMIN 3.9 04/03/2022 0451  ? AST 20 04/03/2022 0451  ? ALT 28 04/03/2022 0451  ? ALKPHOS 40 04/03/2022 0451  ? BILITOT 1.1 04/03/2022 0451  ? GFRNONAA >60  04/03/2022 0451  ? GFRAA >60 03/20/2018 2121  ? ?Lipase  ?   ?Component Value Date/Time  ? LIPASE 30 04/01/2022 2102  ? ? ? ? ? ?Studies/Results: ?DG Abdomen 1 View ? ?Result Date: 04/01/2022 ?CLINICAL DATA:  NG tube placement EXAM: ABDOMEN - 1 VIEW COMPARISON:  CT 04/01/2022 FINDINGS: Esophageal tube tip overlies the stomach. Dilated small bowel in the left upper quadrant. Small amount of contrast in the renal collecting systems IMPRESSION: Esophageal tube tip overlies the stomach Electronically Signed   By: Jasmine PangKim  Fujinaga M.D.   On: 04/01/2022 23:25  ? ?CT ABDOMEN PELVIS W CONTRAST ? ?Result Date: 04/01/2022 ?CLINICAL DATA:  Nausea vomiting lack of appetite EXAM: CT ABDOMEN AND PELVIS WITH CONTRAST TECHNIQUE: Multidetector CT imaging of the abdomen and pelvis was performed using the standard protocol following bolus administration of intravenous contrast. RADIATION DOSE REDUCTION: This exam was performed according to the departmental dose-optimization program which includes automated exposure control, adjustment of the mA and/or kV according to patient size and/or use of iterative reconstruction technique. CONTRAST:  100mL OMNIPAQUE IOHEXOL 300 MG/ML  SOLN COMPARISON:  CT report 02/07/2015, CT pelvis 03/19/2018 FINDINGS: Lower chest: Lung bases demonstrate no acute consolidation or effusion. Normal cardiac size. Hepatobiliary: No focal liver abnormality is seen. No gallstones, gallbladder wall thickening,  or biliary dilatation. Pancreas: Unremarkable. No pancreatic ductal dilatation or surrounding inflammatory changes. Spleen: Normal in size without focal abnormality. Adrenals/Urinary Tract: Adrenal glands are unremarkable. Kidneys are normal, without renal calculi, focal lesion, or hydronephrosis. Bladder is unremarkable. Stomach/Bowel: Moderate fluid distension of the stomach. Multiple dilated fluid-filled loops of mid small bowel measuring up to 4.8 cm with gradual transition to decompressed distal small bowel in  the right mid abdomen, suspicious for bowel obstruction. Terminal ileum and colon are relatively decompressed. No acute bowel wall thickening. Vascular/Lymphatic: No significant vascular findings are present. No enlarged abdominal or pelvic lymph nodes. Mild atherosclerosis. Reproductive: Prostate is unremarkable. Other: Negative for pelvic effusion or free air. Musculoskeletal: No acute or significant osseous findings. IMPRESSION: 1. Findings suspicious for small bowel obstruction with probable transition zone in the distal small bowel in the right mid abdomen. No free air or intramural air. Electronically Signed   By: Jasmine Pang M.D.   On: 04/01/2022 22:06  ? ?DG CHEST PORT 1 VIEW ? ?Result Date: 04/02/2022 ?CLINICAL DATA:  Small bowel obstruction EXAM: PORTABLE CHEST 1 VIEW COMPARISON:  12/18/2016 FINDINGS: NG tube is in the stomach. Heart is normal size. No confluent airspace opacities or effusions. No acute bony abnormality. IMPRESSION: No active disease. Electronically Signed   By: Charlett Nose M.D.   On: 04/02/2022 02:58  ? ?DG Abd Portable 1V ? ?Result Date: 04/02/2022 ?CLINICAL DATA:  Small bowel obstruction EXAM: PORTABLE ABDOMEN - 1 VIEW COMPARISON:  04/01/2022 FINDINGS: Dilated small bowel loops noted in the abdomen and pelvis compatible with small bowel obstruction, similar to prior study. NG tube is in the stomach. No organomegaly or free air. IMPRESSION: NG tube in the stomach. Continued small bowel obstruction pattern. Electronically Signed   By: Charlett Nose M.D.   On: 04/02/2022 02:55  ? ?DG Abd Portable 1V-Small Bowel Obstruction Protocol-initial, 8 hr delay ? ?Result Date: 04/02/2022 ?CLINICAL DATA:  8 hour delay small-bowel protocol. EXAM: PORTABLE ABDOMEN - 1 VIEW COMPARISON:  Radiographs earlier today.  CT yesterday. FINDINGS: Administered enteric contrast is seen involving the distal small bowel, as well as the ascending, transverse, descending and sigmoid colon. Enteric contrast extends to  the level of the rectum. There is persistent gaseous distension of the central small bowel loop at 5.2 cm. Enteric tube is faintly visualized IMPRESSION: 1. Administered enteric contrast is seen throughout the colon. This may indicate partial or resolving small bowel obstruction. 2. Persistent gaseous distension of a central small bowel loop at 5.2 cm. Electronically Signed   By: Narda Rutherford M.D.   On: 04/02/2022 19:19   ? ?Anti-infectives: ?Anti-infectives (From admission, onward)  ? ? None  ? ?  ? ? ? ?Assessment/Plan ? SBO  ?- unclear etiology, no history of abdominal surgery but history of abdominal trauma in childhood w/ ?hemoperitoneum. ?- afebrile, VSS ?- CT of the abdomen/pelvis 5/8 shows SBO w/ transition zone in the distal small bowel, right hemiabdomen.  ?-SBO protocol done 5-10.  Contrast has progressed to the colon.  There is ongoing small bowel dilation.  Given ongoing high output from nasogastric tube and limited bowel function, I recommend continuing n.p.o. and NG to low intermittent wall suction today.  Hopefully the patient will clinically progress in the next 24 hours.  No emergent indications for surgery today. ?  ?FEN - NPO, IVF, NG LIWS; allow limited ice chips and 1 popsicle for comfort ?VTE - SCDs, ok for chemical VTE ?ID - None ?Admit - to [facility] service ?   ?  LOS: 1 day  ? ?I reviewed nursing notes, hospitalist notes, last 24 h vitals and pain scores, last 48 h intake and output, last 24 h labs and trends, and last 24 h imaging results. ? ?Hosie Spangle, PA-C ?[facility] ?Please see Amion for pager number during day hours 7:00am-4:30pm ? ? ? ? ? ? ?

## 2022-04-03 NOTE — Progress Notes (Signed)
?PROGRESS NOTE ? ? ? Micheal Zuniga  [id number] DOB: [birth year] DOA: 04/01/2022 ?PCP: Bernerd Limbo, MD  ? ? ? ?Brief Narrative:  ?41 y.o. male PMHx abdominal trauma at the age of 45 fell off two-story building and landed on his stomach, was in the hospital for 7 months, did not require abdominal surgery but had internal abdominal bleeding.  The patient is otherwise fairly healthy, stays active, plays sports, and works as a Regulatory affairs officer.  3 days ago, he developed nausea with vomiting.  Associated with increasing abdominal distention, pain, with lack of bowel movement or flatulence.  He presented to the ED for further evaluation.   ?  ?Work-up in the ED revealed small bowel obstruction with probable transition zone in the distal small bowel, in the right mid-abdomen, no free air or intramural air, seen on CT abdomen and pelvis with contrast.  Patient received 1 L normal saline IV fluid bolus in the ED as well as IV Zofran.  NG tube was placed and EDP requested admission by hospitalist service.  Patient was accepted by Dr. Cyd Silence, [facility], to [place] unit at [facility] as inpatient status for further management of his small bowel obstruction.   ?  ?Upon assessment at [facility] telemetry unit, abdominal pain was 5 out of 10 with NG tube in place.  Abdominal x-ray showed NG tube in good position within the stomach but with continued small bowel obstruction pattern. ? ? ?Subjective: ?5/11 afebrile overnight A/O x4, negative flatulence overnight, positive abdominal tenderness. ? ? ? ?Assessment & Plan: ?Covid vaccination; ?  ?Principal Problem: ?  Small bowel obstruction ([place]) ? ?Small bowel obstruction seen on CT scan ?-Small bowel obstruction with probable transition zone in the distal small bowel, in the right midabdomen, no free air or intramural air, seen on CT scan abdomen and pelvis with contrast. ?NG tube to suction ?-Continue IV fluid hydration, electrolyte repletion, and IV analgesics. ?-Goal potassium  greater than 4.0 ?-Goal magnesium greater than 2.0 ?-Early mobilization with every shift ?-Monitor NG output ?-5/10 [place] central surgery recommendations:  ?no emergent role for surgery currently as he is hemodynamically stable without signs of intestinal ischemia or perforation. Attempt non-op mgmt with NG tube and protocol. Failure to improve in the next 24-48 hours may warrant Dx laparoscopy vs exploratory laparotomy ?-5/11 we will start patient on Reglan in A.m. if no signs of improvement overnight. ?  ?Intractable nausea and vomiting secondary to above ?Symptomatology improved after NG tube placement ?IV antiemetics as needed ?  ?History of abdominal trauma in childhood ?-History significant for abdominal trauma at the age of 62 fell off two-story building and landed on his stomach, was in the hospital for 7 months, did not require abdominal surgery but had internal abdominal bleeding. ?  ?Mildly elevated serum glucose suspect reactive in the setting of SBO ?Serum glucose 105 ?No history of diabetes or glucose intolerance. ?-5/11 glucose WNL ?- 5/11 hemoglobin A1c pending ?  ? ? ?  ? ? ?  ?Mobility Assessment (last 72 hours)   ? ? Mobility Assessment   ? ? [place] Name 04/03/22 [phone] 04/02/22 0815 04/02/22 0737 04/02/22 0208  ?  ? Does patient have an order for bedrest or is patient medically unstable -- No - Continue assessment No - Continue assessment No - Continue assessment   ? What is the highest level of mobility based on the progressive mobility assessment? Level 5 (Walks with assist in room/hall) - Balance while stepping forward/back and can  walk in room with assist - Complete Level 6 (Walks independently in room and hall) - Balance while walking in room without assist - Complete Level 6 (Walks independently in room and hall) - Balance while walking in room without assist - Complete Level 6 (Walks independently in room and hall) - Balance while walking in room without assist - Complete   ? ?  ?  ? ?   ? ? ? ?@IPAL @ ? ?   ?DVT prophylaxis: Subcu heparin ?Code Status: Full ?Family Communication: 5/11 wife present at bedside for discussion plan of care all questions answered ?Status is: Inpatient ? ? ? ?Dispo: The patient is from: Home ?             Anticipated d/c is to: Home ?             Anticipated d/c date is: > 3 days ?             Patient currently is not medically stable to d/c. ? ? ? ? ? ?Consultants:  ?CCS ? ?Procedures/Significant Events:  ? ? ?I have personally reviewed and interpreted all radiology studies and my findings are as above. ? ?VENTILATOR SETTINGS: ? ? ? ?Cultures ? ? ?Antimicrobials: ?Anti-infectives (From admission, onward)  ? ? None  ? ?  ?  ? ? ?Devices ?  ? ?LINES / TUBES:  ? ? ? ? ?Continuous Infusions: ? lactated ringers 75 mL/hr at 04/02/22 1750  ? ? ? ?Objective: ?Vitals:  ? 04/02/22 1415 04/02/22 2129 04/03/22 0449 04/03/22 0459  ?BP: (!) 142/94 123/72 134/87   ?Pulse: 71 (!) 55 65   ?Resp: 14 16 17    ?Temp: 97.9 ?F (36.6 ?C) 97.6 ?F (36.4 ?C) 98.7 ?F (37.1 ?C)   ?TempSrc: Oral Oral    ?SpO2: 100% 100% 99%   ?Weight:    99.5 kg  ?Height:      ? ? ?Intake/Output Summary (Last 24 hours) at 04/03/2022 1105 ?Last data filed at 04/03/2022 0954 ?Gross per 24 hour  ?Intake 637.37 ml  ?Output 1750 ml  ?Net -1112.63 ml  ? ? ?Filed Weights  ? 04/01/22 2057 04/02/22 0043 04/03/22 0459  ?Weight: 58.1 kg 102.7 kg 99.5 kg  ? ? ?Examination: ? ?General: A/O x4, No acute respiratory distress ?Eyes: negative scleral hemorrhage, negative anisocoria, negative icterus ?ENT: Negative Runny nose, negative gingival bleeding, ?Neck:  Negative scars, masses, torticollis, lymphadenopathy, JVD ?Lungs: Clear to auscultation bilaterally without wheezes or crackles ?Cardiovascular: Regular rate and rhythm without murmur gallop or rub normal S1 and S2 ?Abdomen: Positive abdominal pain to palpation most severe epigastric region, nondistended,  negative bowel sounds, no rebound, no ascites, no appreciable  mass ?Extremities: No significant cyanosis, clubbing, or edema bilateral lower extremities ?Skin: Negative rashes, lesions, ulcers ?Psychiatric:  Negative depression, negative anxiety, negative fatigue, negative mania  ?Central nervous system:  Cranial nerves II through XII intact, tongue/uvula midline, all extremities muscle strength 5/5, sensation intact throughout, negative dysarthria, negative expressive aphasia, negative receptive aphasia. ? ?.  ? ? ? ?Data Reviewed: Care during the described time interval was provided by me .  I have reviewed this patient's available data, including medical history, events of note, physical examination, and all test results as part of my evaluation. ? ?CBC: ?Recent Labs  ?Lab 04/01/22 ?2102 04/02/22 ?0428 04/02/22 ?2841 04/03/22 ?0451  ?WBC 6.0 5.2 5.0 5.3  ?NEUTROABS 3.4  --  2.5 3.3  ?HGB 14.6 13.3 13.2 14.1  ?HCT 44.4 41.2 42.1 43.3  ?  MCV 82.7 86.0 86.8 84.7  ?PLT 267 205 204 210  ? ? ?Basic Metabolic Panel: ?Recent Labs  ?Lab 04/01/22 ?2102 04/02/22 ?0428 04/02/22 ?[id number] 04/03/22 ?0451  ?NA 139  --  140 137  ?K 3.8  --  4.7 3.7  ?CL 103  --  104 102  ?CO2 26  --  30 27  ?GLUCOSE 105*  --  100* 84  ?BUN 13  --  12 10  ?CREATININE 1.02 0.99 1.07 0.94  ?CALCIUM 9.4  --  8.9 8.9  ?MG  --   --  2.1 2.0  ?PHOS  --   --  4.0 3.7  ? ? ?GFR: ?Estimated Creatinine Clearance: 126.4 mL/min (by C-G formula based on SCr of 0.94 mg/dL). ?Liver Function Tests: ?Recent Labs  ?Lab 04/01/22 ?2102 04/02/22 ?0711 04/03/22 ?0451  ?AST 25 21 20   ?ALT 30 27 28   ?ALKPHOS 42 36* 40  ?BILITOT 0.6 0.7 1.1  ?PROT 7.9 6.7 6.9  ?ALBUMIN 4.4 3.7 3.9  ? ? ?Recent Labs  ?Lab 04/01/22 ?2102  ?LIPASE 30  ? ? ?No results for input(s): AMMONIA in the last 168 hours. ?Coagulation Profile: ?No results for input(s): INR, PROTIME in the last 168 hours. ?Cardiac Enzymes: ?No results for input(s): CKTOTAL, CKMB, CKMBINDEX, TROPONINI in the last 168 hours. ?BNP (last 3 results) ?No results for input(s): PROBNP in the  last 8760 hours. ?HbA1C: ?No results for input(s): HGBA1C in the last 72 hours. ?CBG: ?No results for input(s): GLUCAP in the last 168 hours. ?Lipid Profile: ?No results for input(s): CHOL, HDL, LDLCALC, TRIG, CHOLHDL

## 2022-04-03 NOTE — Progress Notes (Signed)
?  Transition of Care (TOC) Screening Note ? ? ?Patient Details  ?Name: Micheal Zuniga ?Date of Birth: Feb 06, 1981 ? ? ?Transition of Care (TOC) CM/SW Contact:    ?Seng Fouts, LCSW ?Phone Number: ?04/03/2022, 12:46 PM ? ? ? ?Transition of Care Department [facility]) has reviewed patient and no TOC needs have been identified at this time. We will continue to monitor patient advancement through interdisciplinary progression rounds. If new patient transition needs arise, please place a TOC consult. ? ? ?

## 2022-04-04 ENCOUNTER — Inpatient Hospital Stay (HOSPITAL_COMMUNITY): Payer: 59

## 2022-04-04 DIAGNOSIS — K56609 Unspecified intestinal obstruction, unspecified as to partial versus complete obstruction: Secondary | ICD-10-CM | POA: Diagnosis not present

## 2022-04-04 LAB — CBC WITH DIFFERENTIAL/PLATELET
Abs Immature Granulocytes: 0.01 10*3/uL (ref 0.00–0.07)
Basophils Absolute: 0 10*3/uL (ref 0.0–0.1)
Basophils Relative: 0 %
Eosinophils Absolute: 0.1 10*3/uL (ref 0.0–0.5)
Eosinophils Relative: 2 %
HCT: 43.3 % (ref 39.0–52.0)
Hemoglobin: 14.6 g/dL (ref 13.0–17.0)
Immature Granulocytes: 0 %
Lymphocytes Relative: 25 %
Lymphs Abs: 1.3 10*3/uL (ref 0.7–4.0)
MCH: 28 pg (ref 26.0–34.0)
MCHC: 33.7 g/dL (ref 30.0–36.0)
MCV: 83.1 fL (ref 80.0–100.0)
Monocytes Absolute: 0.5 10*3/uL (ref 0.1–1.0)
Monocytes Relative: 10 %
Neutro Abs: 3.3 10*3/uL (ref 1.7–7.7)
Neutrophils Relative %: 63 %
Platelets: 228 10*3/uL (ref 150–400)
RBC: 5.21 MIL/uL (ref 4.22–5.81)
RDW: 12.6 % (ref 11.5–15.5)
WBC: 5.2 10*3/uL (ref 4.0–10.5)
nRBC: 0 % (ref 0.0–0.2)

## 2022-04-04 LAB — COMPREHENSIVE METABOLIC PANEL
ALT: 27 U/L (ref 0–44)
AST: 19 U/L (ref 15–41)
Albumin: 3.9 g/dL (ref 3.5–5.0)
Alkaline Phosphatase: 43 U/L (ref 38–126)
Anion gap: 10 (ref 5–15)
BUN: 10 mg/dL (ref 6–20)
CO2: 26 mmol/L (ref 22–32)
Calcium: 9.2 mg/dL (ref 8.9–10.3)
Chloride: 102 mmol/L (ref 98–111)
Creatinine, Ser: 0.94 mg/dL (ref 0.61–1.24)
GFR, Estimated: >60 mL/min (ref >60)
Glucose, Bld: 90 mg/dL (ref 70–99)
Potassium: 3.6 mmol/L (ref 3.5–5.1)
Sodium: 138 mmol/L (ref 135–145)
Total Bilirubin: 1.1 mg/dL (ref 0.3–1.2)
Total Protein: 7.2 g/dL (ref 6.5–8.1)

## 2022-04-04 LAB — HEMOGLOBIN A1C
Hgb A1c MFr Bld: 5.4 % (ref 4.8–5.6)
Mean Plasma Glucose: 108.28 mg/dL

## 2022-04-04 LAB — MAGNESIUM: Magnesium: 2 mg/dL (ref 1.7–2.4)

## 2022-04-04 LAB — PHOSPHORUS: Phosphorus: 3.8 mg/dL (ref 2.5–4.6)

## 2022-04-04 MED ORDER — IBUPROFEN 400 MG PO TABS
400.0000 mg | ORAL_TABLET | Freq: Four times a day (QID) | ORAL | Status: DC | PRN
  Administered 2022-04-04: 400 mg via ORAL
  Administered 2022-04-05: 800 mg via ORAL
  Filled 2022-04-04: qty 1
  Filled 2022-04-04: qty 2

## 2022-04-04 MED ORDER — ZOLPIDEM TARTRATE 5 MG PO TABS
10.0000 mg | ORAL_TABLET | Freq: Every evening | ORAL | Status: DC | PRN
  Administered 2022-04-04 – 2022-04-06 (×3): 10 mg via ORAL
  Filled 2022-04-04 (×3): qty 2

## 2022-04-04 MED ORDER — DICYCLOMINE HCL 10 MG PO CAPS
10.0000 mg | ORAL_CAPSULE | Freq: Three times a day (TID) | ORAL | Status: DC
  Administered 2022-04-04 – 2022-04-07 (×8): 10 mg via ORAL
  Filled 2022-04-04 (×8): qty 1

## 2022-04-04 MED ORDER — ACETAMINOPHEN 500 MG PO TABS
1000.0000 mg | ORAL_TABLET | Freq: Four times a day (QID) | ORAL | Status: DC | PRN
  Administered 2022-04-04: 1000 mg via ORAL
  Filled 2022-04-04: qty 2

## 2022-04-04 MED ORDER — DEXTROSE-NACL 5-0.9 % IV SOLN: INTRAVENOUS | Status: DC

## 2022-04-04 MED ORDER — METOCLOPRAMIDE HCL 5 MG/ML IJ SOLN
5.0000 mg | Freq: Three times a day (TID) | INTRAMUSCULAR | Status: DC
  Administered 2022-04-04 – 2022-04-06 (×5): 5 mg via INTRAVENOUS
  Filled 2022-04-04 (×5): qty 2

## 2022-04-04 MED ORDER — PHENOL 1.4 % MT LIQD: 1.0000 | OROMUCOSAL | Status: DC | PRN

## 2022-04-04 MED ORDER — PHENOL 1.4 % MT LIQD
2.0000 | OROMUCOSAL | Status: DC | PRN
Start: 2022-04-04 — End: 2022-04-07
  Administered 2022-04-04: 2 via OROMUCOSAL

## 2022-04-04 MED FILL — Hydromorphone HCl Inj 2 MG/ML: INTRAMUSCULAR | Qty: 0.5 | Status: AC

## 2022-04-04 NOTE — Progress Notes (Signed)
[facility] ?Progress Note ? ?   ?Subjective: ?CC:  ?Reports decrease in distention and 2 small episodes flatus. Small watery stool but no significant BM. Reports ongoing abdominal pain that does not improve with dilaudid - reports soreness and some intermittent cramps. He feels this could also be from retching prior to NG placement. ? ?NG with over 1800 cc per 24 hours ?Objective: ?Vital signs in last 24 hours: ?Temp:  [97.6 ?F (36.4 ?C)-98 ?F (36.7 ?C)] 97.6 ?F (36.4 ?C) (05/12 0559) ?Pulse Rate:  [58-66] 60 (05/12 0559) ?Resp:  [16-20] 16 (05/12 0559) ?BP: (128-143)/(73-85) 139/85 (05/12 0559) ?SpO2:  [98 %-100 %] 98 % (05/12 0559) ?Last BM Date : 04/01/22 ? ?Intake/Output from previous day: ?05/11 0701 - 05/12 0700 ?In: 2638 [P.O.:120; I.V.:2518] ?Out: 1200 [Emesis/NG output:1200] ?Intake/Output this shift: ?Total I/O ?In: -  ?Out: 350 [Emesis/NG output:350] ? ?PE: ?Gen:  Alert, NAD, pleasant ?Card:  Regular rate and rhythm, pedal pulses 2+ BL ?Pulm:  Normal effort, clear to auscultation bilaterally ?Abd: Soft, nondistended, tender to palpation in the epigastric region without peritonitis, no hernias, no organomegaly. NG with brown bilious output that is becoming more clear ?Skin: warm and dry, no rashes  ?Psych: A&Ox3  ? ?Lab Results:  ?Recent Labs  ?  04/03/22 ?0451 04/04/22 ?0425  ?WBC 5.3 5.2  ?HGB 14.1 14.6  ?HCT 43.3 43.3  ?PLT 210 228  ? ?BMET ?Recent Labs  ?  04/03/22 ?0451 04/04/22 ?0425  ?NA 137 138  ?K 3.7 3.6  ?CL 102 102  ?CO2 27 26  ?GLUCOSE 84 90  ?BUN 10 10  ?CREATININE 0.94 0.94  ?CALCIUM 8.9 9.2  ? ?PT/INR ?No results for input(s): LABPROT, INR in the last 72 hours. ?CMP  ?   ?Component Value Date/Time  ? NA 138 04/04/2022 0425  ? K 3.6 04/04/2022 0425  ? CL 102 04/04/2022 0425  ? CO2 26 04/04/2022 0425  ? GLUCOSE 90 04/04/2022 0425  ? BUN 10 04/04/2022 0425  ? CREATININE 0.94 04/04/2022 0425  ? CALCIUM 9.2 04/04/2022 0425  ? PROT 7.2 04/04/2022 0425  ? ALBUMIN 3.9 04/04/2022 0425   ? AST 19 04/04/2022 0425  ? ALT 27 04/04/2022 0425  ? ALKPHOS 43 04/04/2022 0425  ? BILITOT 1.1 04/04/2022 0425  ? GFRNONAA >60 04/04/2022 0425  ? GFRAA >60 03/20/2018 2121  ? ?Lipase  ?   ?Component Value Date/Time  ? LIPASE 30 04/01/2022 2102  ? ? ? ? ? ?Studies/Results: ?DG Abd Portable 1V ? ?Result Date: 04/03/2022 ?CLINICAL DATA:  NG tube position. EXAM: PORTABLE ABDOMEN - 1 VIEW COMPARISON:  04/02/2022 FINDINGS: NG tube tip is in the mid stomach with proximal side port just below the GE junction. Mild gaseous distention of small bowel identified in the central abdomen with contrast material seen in the colonic lumen. IMPRESSION: NG tube tip is in the mid stomach. Electronically Signed   By: Misty Stanley M.D.   On: 04/03/2022 13:17  ? ?DG Abd Portable 1V-Small Bowel Obstruction Protocol-initial, 8 hr delay ? ?Result Date: 04/02/2022 ?CLINICAL DATA:  8 hour delay small-bowel protocol. EXAM: PORTABLE ABDOMEN - 1 VIEW COMPARISON:  Radiographs earlier today.  CT yesterday. FINDINGS: Administered enteric contrast is seen involving the distal small bowel, as well as the ascending, transverse, descending and sigmoid colon. Enteric contrast extends to the level of the rectum. There is persistent gaseous distension of the central small bowel loop at 5.2 cm. Enteric tube is faintly visualized IMPRESSION: 1. Administered enteric  contrast is seen throughout the colon. This may indicate partial or resolving small bowel obstruction. 2. Persistent gaseous distension of a central small bowel loop at 5.2 cm. Electronically Signed   By: Keith Rake M.D.   On: 04/02/2022 19:19   ? ?Anti-infectives: ?Anti-infectives (From admission, onward)  ? ? None  ? ?  ? ? ? ?Assessment/Plan ? SBO  ?- unclear etiology, no history of abdominal surgery but history of abdominal trauma in childhood w/ ?hemoperitoneum. ?- afebrile, VSS ?- CT of the abdomen/pelvis 5/8 shows SBO w/ transition zone in the distal small bowel, right hemiabdomen.   ?-SBO protocol done 5-10.  Contrast has progressed to the colon.  ?- clinically the patient has started to have a very small amt flatus, small volume liquid stool. He has ongoing high NG output ( 1200 cc/24h) but it may be slightly more clear than yesterday. He also has some ongoing abdominal pain. I do think he will continue to improve with non-op mgmt. Clamp NG tube and allow clears from floor. Resume wall suction if he develops nausea/vomiting/distention.  ?FEN - NPO, clamp ng tube and allow sips from floor. ?VTE - SCDs, ok for chemical VTE ?ID - None ?Admit - to [facility] service ?   ? LOS: 2 days  ? ?I reviewed nursing notes, hospitalist notes, last 24 h vitals and pain scores, last 48 h intake and output, last 24 h labs and trends, and last 24 h imaging results. ? ?Obie Dredge, PA-C ?[facility] ?Please see Amion for pager number during day hours 7:00am-4:30pm ? ? ? ? ? ? ?

## 2022-04-04 NOTE — Progress Notes (Signed)
?PROGRESS NOTE ? ? ? Micheal Zuniga?Micheal Zuniga  ZOX:[id number] DOB: [birth year] DOA: 04/01/2022 ?PCP: Tracey HarriesBouska, David, MD  ? ? ? ?Brief Narrative:  ?41 y.o. male PMHx abdominal trauma at the age of 7 fell off two-story building and landed on his stomach, was in the hospital for 7 months, did not require abdominal surgery but had internal abdominal bleeding.  The patient is otherwise fairly healthy, stays active, plays sports, and works as a Sports administratorrestaurant owner.  3 days ago, he developed nausea with vomiting.  Associated with increasing abdominal distention, pain, with lack of bowel movement or flatulence.  He presented to the ED for further evaluation.   ?  ?Work-up in the ED revealed small bowel obstruction with probable transition zone in the distal small bowel, in the right mid-abdomen, no free air or intramural air, seen on CT abdomen and pelvis with contrast.  Patient received 1 L normal saline IV fluid bolus in the ED as well as IV Zofran.  NG tube was placed and EDP requested admission by hospitalist service.  [facility] for further management of his small bowel obstruction.   ?  ?[facility], abdominal pain was 5 out of 10 with NG tube in place.  Abdominal x-ray showed NG tube in good position within the stomach but with continued small bowel obstruction pattern. ? ? ?Subjective: ?5/12 afebrile overnight.  Negative nausea, negative vomiting, positive flatulence, positive to BM overnight.  Positive continued cramping abdominal pain ? ? ? ? ?Assessment & Plan: ?Covid vaccination; ?  ?Principal Problem: ?  Small bowel obstruction (HCC) ? ?Small bowel obstruction seen on CT scan ?-Small bowel obstruction with probable transition zone in the distal small bowel, in the right midabdomen, no free air or intramural air, seen on CT scan abdomen and pelvis with contrast. ?NG tube to suction ?-Continue IV fluid hydration,  electrolyte repletion, and IV analgesics. ?-Goal potassium greater than 4.0 ?-Goal magnesium greater than 2.0 ?-Early mobilization with every shift ?-Monitor NG output ?-5/10 [place] central surgery recommendations:  ?no emergent role for surgery currently as he is hemodynamically stable without signs of intestinal ischemia or perforation. Attempt non-op mgmt with NG tube and protocol. Failure to improve in the next 24-48 hours may warrant Dx laparoscopy vs exploratory laparotomy ?-5/12 patient with bowel sounds, and overnight BM and flatulence appears SBO beginning to resolve. ?-5/12 Reglan IV 5 mg TID ?-5/12 start clear liquid diet. ?  ?Intractable nausea and vomiting secondary to above ?-Symptomatology improved after NG tube placement ?-IV antiemetics as needed ?-5/12 clamp NG tube ? ?Abdominal pain ?- 5/12 although patient appears to be improving continue to have cramping abdominal pain. ?- 5/12 Bentyl 10 mg qac ?-Continue current pain meds. ?  ?History of abdominal trauma in childhood ?-History significant for abdominal trauma at the age of 7 fell off two-story building and landed on his stomach, was in the hospital for 7 months, did not require abdominal surgery but had internal abdominal bleeding. ?  ?Mildly elevated serum glucose suspect reactive in the setting of SBO ?-5/11 hemoglobin A1c= 5.4: Does not meet criteria for prediabetes/diabetes. ?Serum glucose 105 ?No history of diabetes or glucose intolerance. ?-5/11 glucose WNL ?- 5/11 hemoglobin A1c pending ? ?Insomnia ?- 5/12 Ambien PRN ?  ? ? ?  ? ? ?  ?Mobility Assessment (last 72 hours)   ? ? Mobility Assessment   ? ? Row  Name 04/03/22 1751 04/02/22 0815 04/02/22 0737 04/02/22 0208  ?  ? Does patient have an order for bedrest or is patient medically unstable -- No - Continue assessment No - Continue assessment No - Continue assessment   ? What is the highest level of mobility based on the progressive mobility assessment? Level 5 (Walks with assist in  room/hall) - Balance while stepping forward/back and can walk in room with assist - Complete Level 6 (Walks independently in room and hall) - Balance while walking in room without assist - Complete Level 6 (Walks independently in room and hall) - Balance while walking in room without assist - Complete Level 6 (Walks independently in room and hall) - Balance while walking in room without assist - Complete   ? ?  ?  ? ?  ? ? ? ?@IPAL @ ? ?   ?DVT prophylaxis: Subcu heparin ?Code Status: Full ?Family Communication: 5/12 wife present at bedside for discussion plan of care all questions answered ?Status is: Inpatient ? ? ? ?Dispo: The patient is from: Home ?             Anticipated d/c is to: Home ?             Anticipated d/c date is: > 3 days ?             Patient currently is not medically stable to d/c. ? ? ? ? ? ?Consultants:  ?CCS ? ?Procedures/Significant Events:  ? ? ?I have personally reviewed and interpreted all radiology studies and my findings are as above. ? ?VENTILATOR SETTINGS: ? ? ? ?Cultures ? ? ?Antimicrobials: ?Anti-infectives (From admission, onward)  ? ? None  ? ?  ?  ? ? ?Devices ?  ? ?LINES / TUBES:  ? ? ? ? ?Continuous Infusions: ? ? ? ? ?Objective: ?Vitals:  ? 04/03/22 0459 04/03/22 1417 04/03/22 2148 04/04/22 0559  ?BP:  (!) 143/77 128/73 139/85  ?Pulse:  (!) 58 66 60  ?Resp:  16 20 16   ?Temp:  97.7 ?F (36.5 ?C) 98 ?F (36.7 ?C) 97.6 ?F (36.4 ?C)  ?TempSrc:   Oral   ?SpO2:  100% 100% 98%  ?Weight: 99.5 kg     ?Height:      ? ? ?Intake/Output Summary (Last 24 hours) at 04/04/2022 0839 ?Last data filed at 04/04/2022 0600 ?Gross per 24 hour  ?Intake 2637.96 ml  ?Output 1200 ml  ?Net 1437.96 ml  ? ? ?Filed Weights  ? 04/01/22 2057 04/02/22 0043 04/03/22 0459  ?Weight: 58.1 kg 102.7 kg 99.5 kg  ? ? ?Examination: ? ?General: A/O x4, No acute respiratory distress ?Eyes: negative scleral hemorrhage, negative anisocoria, negative icterus ?ENT: Negative Runny nose, negative gingival bleeding, ?Neck:  Negative  scars, masses, torticollis, lymphadenopathy, JVD ?Lungs: Clear to auscultation bilaterally without wheezes or crackles ?Cardiovascular: Regular rate and rhythm without murmur gallop or rub normal S1 and S2 ?Abdomen: Positive abdominal pain to palpation most severe epigastric region, nondistended,  negative bowel sounds, no rebound, no ascites, no appreciable mass ?Extremities: No significant cyanosis, clubbing, or edema bilateral lower extremities ?Skin: Negative rashes, lesions, ulcers ?Psychiatric:  Negative depression, negative anxiety, negative fatigue, negative mania  ?Central nervous system:  Cranial nerves II through XII intact, tongue/uvula midline, all extremities muscle strength 5/5, sensation intact throughout, negative dysarthria, negative expressive aphasia, negative receptive aphasia. ? ?.  ? ? ? ?Data Reviewed: Care during the described time interval was provided by me .  I have reviewed this patient's  available data, including medical history, events of note, physical examination, and all test results as part of my evaluation. ? ?CBC: ?Recent Labs  ?Lab 04/01/22 ?2102 04/02/22 ?0428 04/02/22 ?1324 04/03/22 ?0451 04/04/22 ?0425  ?WBC 6.0 5.2 5.0 5.3 5.2  ?NEUTROABS 3.4  --  2.5 3.3 3.3  ?HGB 14.6 13.3 13.2 14.1 14.6  ?HCT 44.4 41.2 42.1 43.3 43.3  ?MCV 82.7 86.0 86.8 84.7 83.1  ?PLT 267 205 204 210 228  ? ? ?Basic Metabolic Panel: ?Recent Labs  ?Lab 04/01/22 ?2102 04/02/22 ?0428 04/02/22 ?4010 04/03/22 ?0451 04/04/22 ?0425  ?NA 139  --  140 137 138  ?K 3.8  --  4.7 3.7 3.6  ?CL 103  --  104 102 102  ?CO2 26  --  30 27 26   ?GLUCOSE 105*  --  100* 84 90  ?BUN 13  --  12 10 10   ?CREATININE 1.02 0.99 1.07 0.94 0.94  ?CALCIUM 9.4  --  8.9 8.9 9.2  ?MG  --   --  2.1 2.0 2.0  ?PHOS  --   --  4.0 3.7 3.8  ? ? ?GFR: ?Estimated Creatinine Clearance: 126.4 mL/min (by C-G formula based on SCr of 0.94 mg/dL). ?Liver Function Tests: ?Recent Labs  ?Lab 04/01/22 ?2102 04/02/22 ?0711 04/03/22 ?0451 04/04/22 ?0425   ?AST 25 21 20 19   ?ALT 30 27 28 27   ?ALKPHOS 42 36* 40 43  ?BILITOT 0.6 0.7 1.1 1.1  ?PROT 7.9 6.7 6.9 7.2  ?ALBUMIN 4.4 3.7 3.9 3.9  ? ? ?Recent Labs  ?Lab 04/01/22 ?2102  ?LIPASE 30  ? ? ?No results f

## 2022-04-05 DIAGNOSIS — K56609 Unspecified intestinal obstruction, unspecified as to partial versus complete obstruction: Secondary | ICD-10-CM | POA: Diagnosis not present

## 2022-04-05 LAB — CBC WITH DIFFERENTIAL/PLATELET
Abs Immature Granulocytes: 0.01 10*3/uL (ref 0.00–0.07)
Basophils Absolute: 0 10*3/uL (ref 0.0–0.1)
Basophils Relative: 0 %
Eosinophils Absolute: 0.1 10*3/uL (ref 0.0–0.5)
Eosinophils Relative: 3 %
HCT: 42.7 % (ref 39.0–52.0)
Hemoglobin: 14.1 g/dL (ref 13.0–17.0)
Immature Granulocytes: 0 %
Lymphocytes Relative: 43 %
Lymphs Abs: 1.6 10*3/uL (ref 0.7–4.0)
MCH: 27.8 pg (ref 26.0–34.0)
MCHC: 33 g/dL (ref 30.0–36.0)
MCV: 84.2 fL (ref 80.0–100.0)
Monocytes Absolute: 0.6 10*3/uL (ref 0.1–1.0)
Monocytes Relative: 15 %
Neutro Abs: 1.5 10*3/uL — ABNORMAL LOW (ref 1.7–7.7)
Neutrophils Relative %: 39 %
Platelets: 227 10*3/uL (ref 150–400)
RBC: 5.07 MIL/uL (ref 4.22–5.81)
RDW: 12.9 % (ref 11.5–15.5)
WBC: 3.9 10*3/uL — ABNORMAL LOW (ref 4.0–10.5)
nRBC: 0 % (ref 0.0–0.2)

## 2022-04-05 LAB — COMPREHENSIVE METABOLIC PANEL
ALT: 27 U/L (ref 0–44)
AST: 20 U/L (ref 15–41)
Albumin: 3.8 g/dL (ref 3.5–5.0)
Alkaline Phosphatase: 39 U/L (ref 38–126)
Anion gap: 7 (ref 5–15)
BUN: 7 mg/dL (ref 6–20)
CO2: 28 mmol/L (ref 22–32)
Calcium: 8.9 mg/dL (ref 8.9–10.3)
Chloride: 103 mmol/L (ref 98–111)
Creatinine, Ser: 1.01 mg/dL (ref 0.61–1.24)
GFR, Estimated: >60 mL/min (ref >60)
Glucose, Bld: 110 mg/dL — ABNORMAL HIGH (ref 70–99)
Potassium: 3.6 mmol/L (ref 3.5–5.1)
Sodium: 138 mmol/L (ref 135–145)
Total Bilirubin: 0.6 mg/dL (ref 0.3–1.2)
Total Protein: 7 g/dL (ref 6.5–8.1)

## 2022-04-05 LAB — MAGNESIUM: Magnesium: 2.2 mg/dL (ref 1.7–2.4)

## 2022-04-05 LAB — PHOSPHORUS: Phosphorus: 3.7 mg/dL (ref 2.5–4.6)

## 2022-04-05 NOTE — Progress Notes (Signed)
? ?  Subjective/Chief Complaint: ?No complaints this morning ?No nausea or bloating since NG removed late yesterday ?Passing flatus ? ? ?Objective: ?Vital signs in last 24 hours: ?Temp:  [97.4 ?F (36.3 ?C)-97.9 ?F (36.6 ?C)] 97.4 ?F (36.3 ?C) (05/13 [id number]) ?Pulse Rate:  [46-56] 46 (05/13 0558) ?Resp:  [16-18] 16 (05/13 0558) ?BP: (122-133)/(66-78) 123/78 (05/13 [id number]) ?SpO2:  [97 %-99 %] 99 % (05/13 0558) ?Last BM Date : 04/04/22 (per pt) ? ?Intake/Output from previous day: ?05/12 0701 - 05/13 0700 ?In: 1484.9 [I.V.:1484.9] ?Out: 450 [Emesis/NG output:450] ?Intake/Output this shift: ?No intake/output data recorded. ? ?Exam: ?Awake and alert ?Abdomen soft, NT/ND ? ?Lab Results:  ?Recent Labs  ?  04/04/22 ?0425 04/05/22 ?[id number]  ?WBC 5.2 3.9*  ?HGB 14.6 14.1  ?HCT 43.3 42.7  ?PLT 228 227  ? ?BMET ?Recent Labs  ?  04/04/22 ?0425 04/05/22 ?[id number]  ?NA 138 138  ?K 3.6 3.6  ?CL 102 103  ?CO2 26 28  ?GLUCOSE 90 110*  ?BUN 10 7  ?CREATININE 0.94 1.01  ?CALCIUM 9.2 8.9  ? ?PT/INR ?No results for input(s): LABPROT, INR in the last 72 hours. ?ABG ?No results for input(s): PHART, HCO3 in the last 72 hours. ? ?Invalid input(s): PCO2, PO2 ? ?Studies/Results: ?DG Abd 2 Views ? ?Result Date: 04/04/2022 ?CLINICAL DATA:  Small-bowel obstruction EXAM: ABDOMEN - 2 VIEW COMPARISON:  Radiograph 04/03/2022 FINDINGS: Nasogastric tube tip and side port overlie the stomach. There is paucity of small bowel gas. There is contrast material retained in the colon. There is a single dilated loop of small bowel in the right upper quadrant which measures up to 3.8 cm, previously 3.8 cm. IMPRESSION: Nasogastric tube tip and side port overlie the stomach. Persistent single dilated loop of small bowel in the upper abdomen with contrast material noted to pass into the colon, could indicate partial or resolving obstruction. Electronically Signed   By: Maurine Simmering M.D.   On: 04/04/2022 12:21  ? ?DG Abd Portable 1V ? ?Result Date: 04/03/2022 ?CLINICAL DATA:  NG  tube position. EXAM: PORTABLE ABDOMEN - 1 VIEW COMPARISON:  04/02/2022 FINDINGS: NG tube tip is in the mid stomach with proximal side port just below the GE junction. Mild gaseous distention of small bowel identified in the central abdomen with contrast material seen in the colonic lumen. IMPRESSION: NG tube tip is in the mid stomach. Electronically Signed   By: Misty Stanley M.D.   On: 04/03/2022 13:17   ? ?Anti-infectives: ?Anti-infectives (From admission, onward)  ? ? None  ? ?  ? ? ?Assessment/Plan: ?SBO ? ?Clinically, he is improving. Etiology remains uncertain. ?Will try a full liquids diet ? ? ?Coralie Keens ?04/05/2022 ? ?

## 2022-04-05 NOTE — Progress Notes (Signed)
?PROGRESS NOTE ? ? ? Micheal Zuniga  WPV:[id number] DOB: August 12, 1981 DOA: 04/01/2022 ?PCP: Tracey Harries, MD  ? ? ? ?Brief Narrative:  ?41 y.o. male PMHx abdominal trauma at the age of 7 fell off two-story building and landed on his stomach, was in the hospital for 7 months, did not require abdominal surgery but had internal abdominal bleeding.  The patient is otherwise fairly healthy, stays active, plays sports, and works as a Sports administrator.  3 days ago, he developed nausea with vomiting.  Associated with increasing abdominal distention, pain, with lack of bowel movement or flatulence.  He presented to the ED for further evaluation.   ?  ?Work-up in the ED revealed small bowel obstruction with probable transition zone in the distal small bowel, in the right mid-abdomen, no free air or intramural air, seen on CT abdomen and pelvis with contrast.  Patient received 1 L normal saline IV fluid bolus in the ED as well as IV Zofran.  NG tube was placed and EDP requested admission by hospitalist service.  Patient was accepted by Dr. Leafy Half, [facility], to MedSurg unit at [facility] as inpatient status for further management of his small bowel obstruction.   ?  ?Upon assessment at [facility] telemetry unit, abdominal pain was 5 out of 10 with NG tube in place.  Abdominal x-ray showed NG tube in good position within the stomach but with continued small bowel obstruction pattern. ? ? ?Subjective: ?5/13 afebrile overnight A/O x4.  Ambulating hallway.  Positive flatulence, positive BM ? ? ?Assessment & Plan: ?Covid vaccination; ?  ?Principal Problem: ?  Small bowel obstruction (HCC) ? ?Small bowel obstruction seen on CT scan ?-Small bowel obstruction with probable transition zone in the distal small bowel, in the right midabdomen, no free air or intramural air, seen on CT scan abdomen and pelvis with contrast. ?NG tube to suction ?-Early mobilization with every shift ?-Monitor NG output ?-5/10 [place] central surgery  recommendations:  ?no emergent role for surgery currently as he is hemodynamically stable without signs of intestinal ischemia or perforation. Attempt non-op mgmt with NG tube and protocol. Failure to improve in the next 24-48 hours may warrant Dx laparoscopy vs exploratory laparotomy ?-5/12 patient with bowel sounds, and overnight BM and flatulence appears SBO beginning to resolve. ?-5/12 Reglan IV 5 mg TID ?-5/13 advance to full liquid diet ?  ?Intractable nausea and vomiting secondary to above ?-Symptomatology improved after NG tube placement ?-IV antiemetics as needed ?-5/12 clamp NG tube ?-5/13 NG tube removed ? ?Abdominal pain ?- 5/12 although patient appears to be improving continue to have cramping abdominal pain. ?- 5/12 Bentyl 10 mg qac ?-Continue current pain meds. ?  ?History of abdominal trauma in childhood ?-History significant for abdominal trauma at the age of 7 fell off two-story building and landed on his stomach, was in the hospital for 7 months, did not require abdominal surgery but had internal abdominal bleeding. ?  ?Mildly elevated serum glucose suspect reactive in the setting of SBO ?-5/11 hemoglobin A1c= 5.4: Does not meet criteria for prediabetes/diabetes. ?Serum glucose 105 ?No history of diabetes or glucose intolerance. ?-5/11 glucose WNL ?- 5/12 hemoglobin A1c= 5.4  ? ?Insomnia ?- 5/12 Ambien PRN ?  ? ? ?  ? ? ?  ?Mobility Assessment (last 72 hours)   ? ? Mobility Assessment   ? ? Row Name 04/04/22 1935 04/04/22 1932 04/03/22 0917  ?  ?  ? Does patient have an order for bedrest or is  patient medically unstable No - Continue assessment No - Continue assessment --    ? What is the highest level of mobility based on the progressive mobility assessment? Level 5 (Walks with assist in room/hall) - Balance while stepping forward/back and can walk in room with assist - Complete Level 5 (Walks with assist in room/hall) - Balance while stepping forward/back and can walk in room with assist -  Complete Level 5 (Walks with assist in room/hall) - Balance while stepping forward/back and can walk in room with assist - Complete    ? ?  ?  ? ?  ? ? ? ?@IPAL @ ? ?   ?DVT prophylaxis: Subcu heparin ?Code Status: Full ?Family Communication: 5/12 wife present at bedside for discussion plan of care all questions answered ?Status is: Inpatient ? ? ? ?Dispo: The patient is from: Home ?             Anticipated d/c is to: Home ?             Anticipated d/c date is: > 3 days ?             Patient currently is not medically stable to d/c. ? ? ? ? ? ?Consultants:  ?CCS ? ?Procedures/Significant Events:  ? ? ?I have personally reviewed and interpreted all radiology studies and my findings are as above. ? ?VENTILATOR SETTINGS: ? ? ? ?Cultures ? ? ?Antimicrobials: ?Anti-infectives (From admission, onward)  ? ? None  ? ?  ?  ? ? ?Devices ?  ? ?LINES / TUBES:  ? ? ? ? ?Continuous Infusions: ? dextrose 5 % and 0.9% NaCl 75 mL/hr at 04/05/22 [id number]  ? ? ? ? ?Objective: ?Vitals:  ? 04/04/22 1332 04/04/22 1745 04/04/22 2242 04/05/22 0558  ?BP: 126/73 133/66 122/75 123/78  ?Pulse: (!) 56 (!) 55 (!) 48 (!) 46  ?Resp: 18 18 16 16   ?Temp: 97.9 ?F (36.6 ?C) 97.7 ?F (36.5 ?C) 97.8 ?F (36.6 ?C) (!) 97.4 ?F (36.3 ?C)  ?TempSrc: Oral Oral Oral Oral  ?SpO2: 97% 99% 99% 99%  ?Weight:      ?Height:      ? ? ?Intake/Output Summary (Last 24 hours) at 04/05/2022 1102 ?Last data filed at 04/05/2022 1000 ?Gross per 24 hour  ?Intake 1844.92 ml  ?Output 100 ml  ?Net 1744.92 ml  ? ? ?Filed Weights  ? 04/01/22 2057 04/02/22 0043 04/03/22 0459  ?Weight: 58.1 kg 102.7 kg 99.5 kg  ? ? ?Examination: ? ?General: A/O x4, No acute respiratory distress ?Eyes: negative scleral hemorrhage, negative anisocoria, negative icterus ?ENT: Negative Runny nose, negative gingival bleeding, ?Neck:  Negative scars, masses, torticollis, lymphadenopathy, JVD ?Lungs: Clear to auscultation bilaterally without wheezes or crackles ?Cardiovascular: Regular rate and rhythm without  murmur gallop or rub normal S1 and S2 ?Abdomen: Positive abdominal pain to palpation most severe epigastric region, nondistended,  negative bowel sounds, no rebound, no ascites, no appreciable mass ?Extremities: No significant cyanosis, clubbing, or edema bilateral lower extremities ?Skin: Negative rashes, lesions, ulcers ?Psychiatric:  Negative depression, negative anxiety, negative fatigue, negative mania  ?Central nervous system:  Cranial nerves II through XII intact, tongue/uvula midline, all extremities muscle strength 5/5, sensation intact throughout, negative dysarthria, negative expressive aphasia, negative receptive aphasia. ? ?.  ? ? ? ?Data Reviewed: Care during the described time interval was provided by me .  I have reviewed this patient's available data, including medical history, events of note, physical examination, and all test results as part of my  evaluation. ? ?CBC: ?Recent Labs  ?Lab 04/01/22 ?2102 04/02/22 ?0428 04/02/22 ?0711 04/03/22 ?0451 04/04/22 ?0425 04/05/22 ?8841  ?WBC 6.0 5.2 5.0 5.3 5.2 3.9*  ?NEUTROABS 3.4  --  2.5 3.3 3.3 1.5*  ?HGB 14.6 13.3 13.2 14.1 14.6 14.1  ?HCT 44.4 41.2 42.1 43.3 43.3 42.7  ?MCV 82.7 86.0 86.8 84.7 83.1 84.2  ?PLT 267 205 204 210 228 227  ? ? ?Basic Metabolic Panel: ?Recent Labs  ?Lab 04/01/22 ?2102 04/02/22 ?0428 04/02/22 ?0711 04/03/22 ?0451 04/04/22 ?0425 04/05/22 ?6606  ?NA 139  --  140 137 138 138  ?K 3.8  --  4.7 3.7 3.6 3.6  ?CL 103  --  104 102 102 103  ?CO2 26  --  30 27 26 28   ?GLUCOSE 105*  --  100* 84 90 110*  ?BUN 13  --  12 10 10 7   ?CREATININE 1.02 0.99 1.07 0.94 0.94 1.01  ?CALCIUM 9.4  --  8.9 8.9 9.2 8.9  ?MG  --   --  2.1 2.0 2.0 2.2  ?PHOS  --   --  4.0 3.7 3.8 3.7  ? ? ?GFR: ?Estimated Creatinine Clearance: 117.6 mL/min (by C-G formula based on SCr of 1.01 mg/dL). ?Liver Function Tests: ?Recent Labs  ?Lab 04/01/22 ?2102 04/02/22 ?0711 04/03/22 ?0451 04/04/22 ?0425 04/05/22 ?06/04/22  ?AST 25 21 20 19 20   ?ALT 30 27 28 27 27   ?ALKPHOS 42 36*  40 43 39  ?BILITOT 0.6 0.7 1.1 1.1 0.6  ?PROT 7.9 6.7 6.9 7.2 7.0  ?ALBUMIN 4.4 3.7 3.9 3.9 3.8  ? ? ?Recent Labs  ?Lab 04/01/22 ?2102  ?LIPASE 30  ? ? ?No results for input(s): AMMONIA in the last 168 hours. ?Coagulati

## 2022-04-06 DIAGNOSIS — R112 Nausea with vomiting, unspecified: Secondary | ICD-10-CM | POA: Diagnosis present

## 2022-04-06 DIAGNOSIS — R1013 Epigastric pain: Secondary | ICD-10-CM | POA: Diagnosis not present

## 2022-04-06 DIAGNOSIS — G4709 Other insomnia: Secondary | ICD-10-CM

## 2022-04-06 DIAGNOSIS — R7309 Other abnormal glucose: Secondary | ICD-10-CM | POA: Diagnosis present

## 2022-04-06 DIAGNOSIS — R109 Unspecified abdominal pain: Secondary | ICD-10-CM | POA: Diagnosis present

## 2022-04-06 DIAGNOSIS — K56609 Unspecified intestinal obstruction, unspecified as to partial versus complete obstruction: Secondary | ICD-10-CM | POA: Diagnosis not present

## 2022-04-06 DIAGNOSIS — G4700 Insomnia, unspecified: Secondary | ICD-10-CM | POA: Diagnosis present

## 2022-04-06 LAB — CBC WITH DIFFERENTIAL/PLATELET
Abs Immature Granulocytes: 0 10*3/uL (ref 0.00–0.07)
Basophils Absolute: 0 10*3/uL (ref 0.0–0.1)
Basophils Relative: 0 %
Eosinophils Absolute: 0.1 10*3/uL (ref 0.0–0.5)
Eosinophils Relative: 3 %
HCT: 41.9 % (ref 39.0–52.0)
Hemoglobin: 13.4 g/dL (ref 13.0–17.0)
Immature Granulocytes: 0 %
Lymphocytes Relative: 43 %
Lymphs Abs: 1.5 10*3/uL (ref 0.7–4.0)
MCH: 27.1 pg (ref 26.0–34.0)
MCHC: 32 g/dL (ref 30.0–36.0)
MCV: 84.6 fL (ref 80.0–100.0)
Monocytes Absolute: 0.4 10*3/uL (ref 0.1–1.0)
Monocytes Relative: 12 %
Neutro Abs: 1.5 10*3/uL — ABNORMAL LOW (ref 1.7–7.7)
Neutrophils Relative %: 42 %
Platelets: 235 10*3/uL (ref 150–400)
RBC: 4.95 MIL/uL (ref 4.22–5.81)
RDW: 12.7 % (ref 11.5–15.5)
WBC: 3.5 10*3/uL — ABNORMAL LOW (ref 4.0–10.5)
nRBC: 0 % (ref 0.0–0.2)

## 2022-04-06 LAB — COMPREHENSIVE METABOLIC PANEL
ALT: 46 U/L — ABNORMAL HIGH (ref 0–44)
AST: 39 U/L (ref 15–41)
Albumin: 3.7 g/dL (ref 3.5–5.0)
Alkaline Phosphatase: 36 U/L — ABNORMAL LOW (ref 38–126)
Anion gap: 8 (ref 5–15)
BUN: 6 mg/dL (ref 6–20)
CO2: 25 mmol/L (ref 22–32)
Calcium: 9.1 mg/dL (ref 8.9–10.3)
Chloride: 107 mmol/L (ref 98–111)
Creatinine, Ser: 0.98 mg/dL (ref 0.61–1.24)
GFR, Estimated: >60 mL/min (ref >60)
Glucose, Bld: 110 mg/dL — ABNORMAL HIGH (ref 70–99)
Potassium: 4 mmol/L (ref 3.5–5.1)
Sodium: 140 mmol/L (ref 135–145)
Total Bilirubin: 0.7 mg/dL (ref 0.3–1.2)
Total Protein: 6.9 g/dL (ref 6.5–8.1)

## 2022-04-06 LAB — MAGNESIUM: Magnesium: 2 mg/dL (ref 1.7–2.4)

## 2022-04-06 LAB — PHOSPHORUS: Phosphorus: 4 mg/dL (ref 2.5–4.6)

## 2022-04-06 NOTE — Plan of Care (Signed)
  Problem: Education: Goal: Knowledge of General Education information will improve Description: Including pain rating scale, medication(s)/side effects and non-pharmacologic comfort measures Outcome: Progressing   Problem: Clinical Measurements: Goal: Ability to maintain clinical measurements within normal limits will improve Outcome: Progressing Goal: Will remain free from infection Outcome: Progressing Goal: Diagnostic test results will improve Outcome: Progressing Goal: Respiratory complications will improve Outcome: Progressing Goal: Cardiovascular complication will be avoided Outcome: Progressing   Problem: Activity: Goal: Risk for activity intolerance will decrease Outcome: Progressing   Problem: Nutrition: Goal: Adequate nutrition will be maintained Outcome: Progressing   Problem: Elimination: Goal: Will not experience complications related to bowel motility Outcome: Progressing Goal: Will not experience complications related to urinary retention Outcome: Progressing   Problem: Pain Managment: Goal: General experience of comfort will improve Outcome: Progressing   Problem: Skin Integrity: Goal: Risk for impaired skin integrity will decrease Outcome: Progressing   

## 2022-04-06 NOTE — Progress Notes (Signed)
? ?  Subjective/Chief Complaint: ?No complaints ?Had bm ?Denies nausea or abd pain ? ? ?Objective: ?Vital signs in last 24 hours: ?Temp:  [97.6 ?F (36.4 ?C)-97.9 ?F (36.6 ?C)] 97.8 ?F (36.6 ?C) (05/14 0606) ?Pulse Rate:  [50-57] 50 (05/14 0606) ?Resp:  [14-19] 18 (05/14 0606) ?BP: (123-140)/(74-85) 123/85 (05/14 0606) ?SpO2:  [98 %-100 %] 100 % (05/14 0606) ?Weight:  [97.6 kg] 97.6 kg (05/14 0500) ?Last BM Date : 04/04/22 ? ?Intake/Output from previous day: ?05/13 0701 - 05/14 0700 ?In: 3033.1 [P.O.:1600; I.V.:1433.1] ?Out: -  ?Intake/Output this shift: ?No intake/output data recorded. ? ?Exam: ?Awake and alert ?Abdomen soft, NT/ND ? ?Lab Results:  ?Recent Labs  ?  04/05/22 ?9390 04/06/22 ?0416  ?WBC 3.9* 3.5*  ?HGB 14.1 13.4  ?HCT 42.7 41.9  ?PLT 227 235  ? ?BMET ?Recent Labs  ?  04/05/22 ?3009 04/06/22 ?0416  ?NA 138 140  ?K 3.6 4.0  ?CL 103 107  ?CO2 28 25  ?GLUCOSE 110* 110*  ?BUN 7 6  ?CREATININE 1.01 0.98  ?CALCIUM 8.9 9.1  ? ?PT/INR ?No results for input(s): LABPROT, INR in the last 72 hours. ?ABG ?No results for input(s): PHART, HCO3 in the last 72 hours. ? ?Invalid input(s): PCO2, PO2 ? ?Studies/Results: ?DG Abd 2 Views ? ?Result Date: 04/04/2022 ?CLINICAL DATA:  Small-bowel obstruction EXAM: ABDOMEN - 2 VIEW COMPARISON:  Radiograph 04/03/2022 FINDINGS: Nasogastric tube tip and side port overlie the stomach. There is paucity of small bowel gas. There is contrast material retained in the colon. There is a single dilated loop of small bowel in the right upper quadrant which measures up to 3.8 cm, previously 3.8 cm. IMPRESSION: Nasogastric tube tip and side port overlie the stomach. Persistent single dilated loop of small bowel in the upper abdomen with contrast material noted to pass into the colon, could indicate partial or resolving obstruction. Electronically Signed   By: Caprice Renshaw M.D.   On: 04/04/2022 12:21   ? ?Anti-infectives: ?Anti-infectives (From admission, onward)  ? ? None  ? ?   ? ? ?Assessment/Plan: ?Resolving SBO ? ?Advance to regular diet. ?If tolerated, he can go home after lunch from a surgical standpoint.  Can see [place] in the office PRN ? ? ?Abigail Miyamoto ?04/06/2022 ? ?

## 2022-04-06 NOTE — Progress Notes (Signed)
?PROGRESS NOTE ? ? ? Micheal Zuniga  ASN:[id number] DOB: 25-Mar-1981 DOA: 04/01/2022 ?PCP: Tracey Harries, MD  ? ? ? ?Brief Narrative:  ?41 y.o. male PMHx abdominal trauma at the age of 7 fell off two-story building and landed on his stomach, was in the hospital for 7 months, did not require abdominal surgery but had internal abdominal bleeding.  The patient is otherwise fairly healthy, stays active, plays sports, and works as a Sports administrator.  3 days ago, he developed nausea with vomiting.  Associated with increasing abdominal distention, pain, with lack of bowel movement or flatulence.  He presented to the ED for further evaluation.   ?  ?Work-up in the ED revealed small bowel obstruction with probable transition zone in the distal small bowel, in the right mid-abdomen, no free air or intramural air, seen on CT abdomen and pelvis with contrast.  Patient received 1 L normal saline IV fluid bolus in the ED as well as IV Zofran.  NG tube was placed and EDP requested admission by hospitalist service.  Patient was accepted by Dr. Leafy Half, [facility], to MedSurg unit at [facility] as inpatient status for further management of his small bowel obstruction.   ?  ?Upon assessment at [facility] telemetry unit, abdominal pain was 5 out of 10 with NG tube in place.  Abdominal x-ray showed NG tube in good position within the stomach but with continued small bowel obstruction pattern. ? ? ?Subjective: ?5/14 A/O x4, states has had 1 solid meal today.  Negative nausea (had nausea medication prior to eating).  Negative vomiting, positive diarrhea times multiple episodes. ? ? ? ?Assessment & Plan: ?Covid vaccination; ?  ?Principal Problem: ?  Small bowel obstruction (HCC) ?Active Problems: ?  Intractable nausea and vomiting ?  Elevated glucose level ?  Insomnia ?  Abdominal pain ? ?Small bowel obstruction seen on CT scan ?-Small bowel obstruction with probable transition zone in the distal small bowel, in the right midabdomen, no free  air or intramural air, seen on CT scan abdomen and pelvis with contrast. ?NG tube to suction ?-Early mobilization with every shift ?-Monitor NG output ?-5/10 [place] central surgery recommendations:  ?no emergent role for surgery currently as he is hemodynamically stable without signs of intestinal ischemia or perforation. Attempt non-op mgmt with NG tube and protocol. Failure to improve in the next 24-48 hours may warrant Dx laparoscopy vs exploratory laparotomy ?-5/12 patient with bowel sounds, and overnight BM and flatulence appears SBO beginning to resolve. ?-5/12 Reglan IV 5 mg TID ?-5/13 advance to full liquid diet ?-5/14 advance diet to Regular ?  ?Intractable nausea and vomiting secondary to above ?-Symptomatology improved after NG tube placement ?-IV antiemetics as needed ?-5/12 clamp NG tube ?-5/13 NG tube removed ? ?Abdominal pain ?- 5/12 although patient appears to be improving continue to have cramping abdominal pain. ?- 5/12 Bentyl 10 mg qac ?-Continue current pain meds. ?  ?History of abdominal trauma in childhood ?-History significant for abdominal trauma at the age of 7 fell off two-story building and landed on his stomach, was in the hospital for 7 months, did not require abdominal surgery but had internal abdominal bleeding. ? ?Diarrhea ?- Patient has not been eating solid food until today.  In addition was on Reglan. ?- 5/14 DC Reglan ?  ?Mildly elevated serum glucose suspect reactive in the setting of SBO ?-5/11 hemoglobin A1c= 5.4: Does not meet criteria for prediabetes/diabetes. ?Serum glucose 105 ?No history of diabetes or glucose intolerance. ?-5/11  glucose WNL ?- 5/12 hemoglobin A1c= 5.4  ? ?Insomnia ?- 5/12 Ambien PRN ?  ? ? ?  ? ? ?  ?Mobility Assessment (last 72 hours)   ? ? Mobility Assessment   ? ? Row Name 04/06/22 0724 04/05/22 2200 04/05/22 2115 04/05/22 1100 04/04/22 1935  ? Does patient have an order for bedrest or is patient medically unstable No - Continue assessment No -  Continue assessment No - Continue assessment No - Continue assessment No - Continue assessment  ? What is the highest level of mobility based on the progressive mobility assessment? Level 6 (Walks independently in room and hall) - Balance while walking in room without assist - Complete Level 6 (Walks independently in room and hall) - Balance while walking in room without assist - Complete Level 6 (Walks independently in room and hall) - Balance while walking in room without assist - Complete Level 6 (Walks independently in room and hall) - Balance while walking in room without assist - Complete Level 5 (Walks with assist in room/hall) - Balance while stepping forward/back and can walk in room with assist - Complete  ? ? Row Name 04/04/22 1932  ?  ?  ?  ?  ? Does patient have an order for bedrest or is patient medically unstable No - Continue assessment      ? What is the highest level of mobility based on the progressive mobility assessment? Level 5 (Walks with assist in room/hall) - Balance while stepping forward/back and can walk in room with assist - Complete      ? ?  ?  ? ?  ? ? ? ?@IPAL @ ? ?   ?DVT prophylaxis: Subcu heparin ?Code Status: Full ?Family Communication: 5/12 wife present at bedside for discussion plan of care all questions answered ?Status is: Inpatient ? ? ? ?Dispo: The patient is from: Home ?             Anticipated d/c is to: Home ?             Anticipated d/c date is: 1 day ?             Patient currently is not medically stable to d/c. ? ? ? ? ? ?Consultants:  ?CCS ? ?Procedures/Significant Events:  ? ? ?I have personally reviewed and interpreted all radiology studies and my findings are as above. ? ?VENTILATOR SETTINGS: ? ? ? ?Cultures ? ? ?Antimicrobials: ?Anti-infectives (From admission, onward)  ? ? None  ? ?  ?  ? ? ?Devices ?  ? ?LINES / TUBES:  ? ? ? ? ?Continuous Infusions: ? dextrose 5 % and 0.9% NaCl 75 mL/hr at 04/05/22 2136  ? ? ? ? ?Objective: ?Vitals:  ? 04/05/22 1322 04/05/22  2119 04/06/22 0500 04/06/22 0606  ?BP: 140/74 125/84  123/85  ?Pulse: (!) 57 (!) 51  (!) 50  ?Resp: 14 19  18   ?Temp: 97.6 ?F (36.4 ?C) 97.9 ?F (36.6 ?C)  97.8 ?F (36.6 ?C)  ?TempSrc: Oral   Oral  ?SpO2: 98% 99%  100%  ?Weight:   97.6 kg   ?Height:      ? ? ?Intake/Output Summary (Last 24 hours) at 04/06/2022 1017 ?Last data filed at 04/06/2022 0600 ?Gross per 24 hour  ?Intake 2438.13 ml  ?Output --  ?Net 2438.13 ml  ? ?Filed Weights  ? 04/02/22 0043 04/03/22 0459 04/06/22 0500  ?Weight: 102.7 kg 99.5 kg 97.6 kg  ? ? ?Examination: ? ?General: A/O x4,  No acute respiratory distress ?Eyes: negative scleral hemorrhage, negative anisocoria, negative icterus ?ENT: Negative Runny nose, negative gingival bleeding, ?Neck:  Negative scars, masses, torticollis, lymphadenopathy, JVD ?Lungs: Clear to auscultation bilaterally without wheezes or crackles ?Cardiovascular: Regular rate and rhythm without murmur gallop or rub normal S1 and S2 ?Abdomen: Positive abdominal pain to palpation most severe epigastric region, nondistended,  negative bowel sounds, no rebound, no ascites, no appreciable mass ?Extremities: No significant cyanosis, clubbing, or edema bilateral lower extremities ?Skin: Negative rashes, lesions, ulcers ?Psychiatric:  Negative depression, negative anxiety, negative fatigue, negative mania  ?Central nervous system:  Cranial nerves II through XII intact, tongue/uvula midline, all extremities muscle strength 5/5, sensation intact throughout, negative dysarthria, negative expressive aphasia, negative receptive aphasia. ? ?.  ? ? ? ?Data Reviewed: Care during the described time interval was provided by me .  I have reviewed this patient's available data, including medical history, events of note, physical examination, and all test results as part of my evaluation. ? ?CBC: ?Recent Labs  ?Lab 04/02/22 ?0711 04/03/22 ?0451 04/04/22 ?0425 04/05/22 ?[id number] 04/06/22 ?0416  ?WBC 5.0 5.3 5.2 3.9* 3.5*  ?NEUTROABS 2.5 3.3 3.3 1.5*  1.5*  ?HGB 13.2 14.1 14.6 14.1 13.4  ?HCT 42.1 43.3 43.3 42.7 41.9  ?MCV 86.8 84.7 83.1 84.2 84.6  ?PLT 204 210 228 227 235  ? ?Basic Metabolic Panel: ?Recent Labs  ?Lab 04/02/22 ?0711 04/03/22 ?0451 05/12/

## 2022-04-07 DIAGNOSIS — R7309 Other abnormal glucose: Secondary | ICD-10-CM | POA: Diagnosis not present

## 2022-04-07 DIAGNOSIS — R1013 Epigastric pain: Secondary | ICD-10-CM | POA: Diagnosis not present

## 2022-04-07 DIAGNOSIS — K56609 Unspecified intestinal obstruction, unspecified as to partial versus complete obstruction: Secondary | ICD-10-CM | POA: Diagnosis not present

## 2022-04-07 DIAGNOSIS — R112 Nausea with vomiting, unspecified: Secondary | ICD-10-CM

## 2022-04-07 DIAGNOSIS — G4709 Other insomnia: Secondary | ICD-10-CM | POA: Diagnosis not present

## 2022-04-07 LAB — COMPREHENSIVE METABOLIC PANEL
ALT: 80 U/L — ABNORMAL HIGH (ref 0–44)
AST: 63 U/L — ABNORMAL HIGH (ref 15–41)
Albumin: 3.8 g/dL (ref 3.5–5.0)
Alkaline Phosphatase: 37 U/L — ABNORMAL LOW (ref 38–126)
Anion gap: 8 (ref 5–15)
BUN: 10 mg/dL (ref 6–20)
CO2: 26 mmol/L (ref 22–32)
Calcium: 9.1 mg/dL (ref 8.9–10.3)
Chloride: 103 mmol/L (ref 98–111)
Creatinine, Ser: 1.05 mg/dL (ref 0.61–1.24)
GFR, Estimated: >60 mL/min (ref >60)
Glucose, Bld: 102 mg/dL — ABNORMAL HIGH (ref 70–99)
Potassium: 3.7 mmol/L (ref 3.5–5.1)
Sodium: 137 mmol/L (ref 135–145)
Total Bilirubin: 0.5 mg/dL (ref 0.3–1.2)
Total Protein: 7.1 g/dL (ref 6.5–8.1)

## 2022-04-07 LAB — CBC WITH DIFFERENTIAL/PLATELET
Abs Immature Granulocytes: 0.01 10*3/uL (ref 0.00–0.07)
Basophils Absolute: 0 10*3/uL (ref 0.0–0.1)
Basophils Relative: 0 %
Eosinophils Absolute: 0.1 10*3/uL (ref 0.0–0.5)
Eosinophils Relative: 2 %
HCT: 41.7 % (ref 39.0–52.0)
Hemoglobin: 13.5 g/dL (ref 13.0–17.0)
Immature Granulocytes: 0 %
Lymphocytes Relative: 46 %
Lymphs Abs: 1.9 10*3/uL (ref 0.7–4.0)
MCH: 27.3 pg (ref 26.0–34.0)
MCHC: 32.4 g/dL (ref 30.0–36.0)
MCV: 84.2 fL (ref 80.0–100.0)
Monocytes Absolute: 0.5 10*3/uL (ref 0.1–1.0)
Monocytes Relative: 12 %
Neutro Abs: 1.7 10*3/uL (ref 1.7–7.7)
Neutrophils Relative %: 40 %
Platelets: 223 10*3/uL (ref 150–400)
RBC: 4.95 MIL/uL (ref 4.22–5.81)
RDW: 12.7 % (ref 11.5–15.5)
WBC: 4.2 10*3/uL (ref 4.0–10.5)
nRBC: 0 % (ref 0.0–0.2)

## 2022-04-07 LAB — MAGNESIUM: Magnesium: 2 mg/dL (ref 1.7–2.4)

## 2022-04-07 LAB — PHOSPHORUS: Phosphorus: 5.2 mg/dL — ABNORMAL HIGH (ref 2.5–4.6)

## 2022-04-07 MED ORDER — ZOLPIDEM TARTRATE 10 MG PO TABS: 10.0000 mg | ORAL_TABLET | Freq: Every evening | ORAL | 0 refills | Status: DC | PRN

## 2022-04-07 MED ORDER — LOPERAMIDE HCL 2 MG PO CAPS: 4.0000 mg | ORAL_CAPSULE | ORAL | Status: DC | PRN

## 2022-04-07 MED ORDER — PHENOL 1.4 % MT LIQD: 2.0000 | OROMUCOSAL | 0 refills | Status: DC | PRN

## 2022-04-07 MED ORDER — DICYCLOMINE HCL 10 MG PO CAPS: 10.0000 mg | ORAL_CAPSULE | Freq: Three times a day (TID) | ORAL | 0 refills | Status: DC

## 2022-04-07 MED ORDER — LOPERAMIDE HCL 2 MG PO CAPS: 4.0000 mg | ORAL_CAPSULE | ORAL | 0 refills | Status: DC | PRN

## 2022-04-07 NOTE — Progress Notes (Signed)
Pt was discharged home today. Instructions were reviewed with patient, prescriptions were given  and questions were answered. Pt was taken to main entrance via wheelchair by NT.  

## 2022-04-07 NOTE — Progress Notes (Signed)
? ?  Subjective/Chief Complaint: ?Denies pain or nausea. Having loose stools and flatus. Tolerating PO w/ some bloating and discomfort after meals. Wants to go home but does express some concern about leaving just because he does not want to have to come back. ? ? ?Objective: ?Vital signs in last 24 hours: ?Temp:  [97.7 ?F (36.5 ?C)] 97.7 ?F (36.5 ?C) (05/15 0602) ?Pulse Rate:  [48-51] 48 (05/15 0602) ?Resp:  [14-18] 18 (05/15 0602) ?BP: (122-136)/(73-80) 122/73 (05/15 0602) ?SpO2:  [97 %-99 %] 98 % (05/15 0602) ?Last BM Date : 04/06/22 ? ?Intake/Output from previous day: ?05/14 0701 - 05/15 0700 ?In: 2469.9 [P.O.:720; I.V.:1749.9] ?Out: -  ?Intake/Output this shift: ?No intake/output data recorded. ? ?Exam: ?Awake and alert ?Abdomen soft, NT/ND ? ?Lab Results:  ?Recent Labs  ?  04/06/22 ?0416 04/07/22 ?[id number]  ?WBC 3.5* 4.2  ?HGB 13.4 13.5  ?HCT 41.9 41.7  ?PLT 235 223  ? ?BMET ?Recent Labs  ?  04/06/22 ?0416 04/07/22 ?0429  ?NA 140 137  ?K 4.0 3.7  ?CL 107 103  ?CO2 25 26  ?GLUCOSE 110* 102*  ?BUN 6 10  ?CREATININE 0.98 1.05  ?CALCIUM 9.1 9.1  ? ?PT/INR ?No results for input(s): LABPROT, INR in the last 72 hours. ?ABG ?No results for input(s): PHART, HCO3 in the last 72 hours. ? ?Invalid input(s): PCO2, PO2 ? ?Studies/Results: ?No results found. ? ?Anti-infectives: ?Anti-infectives (From admission, onward)  ? ? None  ? ?  ? ? ?Assessment/Plan: ?Resolving SBO ?Tolerating regular diet. Stable for discharge from a surgical perspective. Can see [place] in the office PRN ? ? ?[place] ?04/07/2022 ? ?

## 2022-04-07 NOTE — Discharge Summary (Signed)
Physician Discharge Summary  ?Micheal CapersMahmoud A Zuniga ZOX:[id number] DOB: 1981/03/31 DOA: 04/01/2022 ? ?PCP: Tracey HarriesBouska, David, MD ? ?Admit date: 04/01/2022 ?Discharge date: 04/07/2022 ? ?Time spent: 35 minutes ? ?Recommendations for Outpatient Follow-up:  ? ?Small bowel obstruction seen on CT scan ?-Small bowel obstruction with probable transition zone in the distal small bowel, in the right midabdomen, no free air or intramural air, seen on CT scan abdomen and pelvis with contrast. ?NG tube to suction ?-Early mobilization with every shift ?-Monitor NG output ?-5/10 [place] central surgery recommendations:  ?no emergent role for surgery currently as he is hemodynamically stable without signs of intestinal ischemia or perforation. Attempt non-op mgmt with NG tube and protocol. Failure to improve in the next 24-48 hours may warrant Dx laparoscopy vs exploratory laparotomy ?-5/12 patient with bowel sounds, and overnight BM and flatulence appears SBO beginning to resolve. ?-5/12 Reglan IV 5 mg TID ?-5/13 advance to full liquid diet ?-5/14 advance diet to Regular ?-Resolved ?  ?Intractable nausea and vomiting secondary to above ?-Symptomatology improved after NG tube placement ?-IV antiemetics as needed ?-5/12 clamp NG tube ?-5/13 NG tube removed ?-Resolved ?  ?Abdominal pain ?- 5/12 although patient appears to be improving continue to have cramping abdominal pain. ?- 5/12 Bentyl 10 mg qac ?-Continue current pain meds. ?  ?History of abdominal trauma in childhood ?-History significant for abdominal trauma at the age of 7 fell off two-story building and landed on his stomach, was in the hospital for 7 months, did not require abdominal surgery but had internal abdominal bleeding. ?  ?Diarrhea ?- Patient has not been eating solid food until today.  In addition was on Reglan. ?- 5/14 DC Reglan ?-Imodium ?-Resolved ?  ?Mildly elevated serum glucose suspect reactive in the setting of SBO ?-5/11 hemoglobin A1c= 5.4: Does not meet criteria for  prediabetes/diabetes. ?Serum glucose 105 ?No history of diabetes or glucose intolerance. ?-5/11 glucose WNL ?- 5/12 hemoglobin A1c= 5.4  ?  ?Insomnia ?- 5/12 Ambien PRN ?  ? ?Discharge Diagnoses:  ?Principal Problem: ?  Small bowel obstruction (HCC) ?Active Problems: ?  Intractable nausea and vomiting ?  Elevated glucose level ?  Insomnia ?  Abdominal pain ? ? ?Discharge Condition: Stable ? ?Diet recommendation: Regular ? ?Filed Weights  ? 04/02/22 0043 04/03/22 0459 04/06/22 0500  ?Weight: 102.7 kg 99.5 kg 97.6 kg  ? ? ?History of present illness:  ?41 y.o. male PMHx abdominal trauma at the age of 7 fell off two-story building and landed on his stomach, was in the hospital for 7 months, did not require abdominal surgery but had internal abdominal bleeding.  The patient is otherwise fairly healthy, stays active, plays sports, and works as a Sports administratorrestaurant owner.  3 days ago, he developed nausea with vomiting.  Associated with increasing abdominal distention, pain, with lack of bowel movement or flatulence.  He presented to the ED for further evaluation.   ?  ?Work-up in the ED revealed small bowel obstruction with probable transition zone in the distal small bowel, in the right mid-abdomen, no free air or intramural air, seen on CT abdomen and pelvis with contrast.  Patient received 1 L normal saline IV fluid bolus in the ED as well as IV Zofran.  NG tube was placed and EDP requested admission by hospitalist service.  [facility] for further management of his small bowel obstruction.   ?  ?[facility], abdominal pain was 5 out of 10 with NG tube in place.  Abdominal x-ray showed NG tube in good position within the stomach but with continued small bowel obstruction pattern. ? ?Hospital Course:  ?As above ? ? ?Consultations: ?CCS ? ? ? ?Discharge Exam: ?Vitals:  ? 04/06/22 0606 04/06/22 1445 04/06/22 2115  04/07/22 0602  ?BP: 123/85 129/80 136/79 122/73  ?Pulse: (!) 50 (!) 51 (!) 51 (!) 48  ?Resp: 18 14 18 18   ?Temp: 97.8 ?F (36.6 ?C)  97.7 ?F (36.5 ?C) 97.7 ?F (36.5 ?C)  ?TempSrc: Oral  Oral Oral  ?SpO2: 100% 99% 97% 98%  ?Weight:      ?Height:      ? ? ?General: A/O x4, No acute respiratory distress ?Eyes: negative scleral hemorrhage, negative anisocoria, negative icterus ?ENT: Negative Runny nose, negative gingival bleeding, ?Neck:  Negative scars, masses, torticollis, lymphadenopathy, JVD ?Lungs: Clear to auscultation bilaterally without wheezes or crackles ?Cardiovascular: Regular rate and rhythm without murmur gallop or rub normal S1 and S2 ? ?Discharge Instructions ? ? ?Allergies as of 04/07/2022   ?No Known Allergies ?  ? ?  ?Medication List  ?  ? ?STOP taking these medications   ? ?acyclovir cream 5 % ?Commonly known as: ZOVIRAX ?  ?acyclovir ointment 5 % ?Commonly known as: ZOVIRAX ?  ?cyclobenzaprine 10 MG tablet ?Commonly known as: FLEXERIL ?  ?meloxicam 15 MG tablet ?Commonly known as: MOBIC ?  ?multivitamin tablet ?  ?ondansetron 4 MG tablet ?Commonly known as: Zofran ?  ?oxyCODONE-acetaminophen 5-325 MG tablet ?Commonly known as: Percocet ?  ? ?  ? ?TAKE these medications   ? ?carboxymethylcellulose 0.5 % Soln ?Commonly known as: REFRESH PLUS ?Place 2 drops into both eyes 2 (two) times daily as needed (dry eye). ?  ?Cholecalciferol 50 MCG (2000 UT) Caps ?Take 1 capsule by mouth daily. ?  ?dicyclomine 10 MG capsule ?Commonly known as: BENTYL ?Take 1 capsule (10 mg total) by mouth 3 (three) times daily before meals. ?  ?Fish Oil 1000 MG Caps ?Take 2,000 mg by mouth daily. ?  ?ibuprofen 200 MG tablet ?Commonly known as: ADVIL ?Take 400-800 mg by mouth daily as needed for moderate pain. ?  ?loperamide 2 MG capsule ?Commonly known as: IMODIUM ?Take 2 capsules (4 mg total) by mouth as needed for diarrhea or loose stools. ?  ?OVER THE COUNTER MEDICATION ?Take 2 capsules by mouth daily. Blood Flow Support ?   ?phenol 1.4 % Liqd ?Commonly known as: CHLORASEPTIC ?Use as directed 2 sprays in the mouth or throat as needed for throat irritation / pain. ?  ?tadalafil 20 MG tablet ?Commonly known as: CIALIS ?Take 20 mg by mouth See admin instructions. Take 20 mg every 3 days PRN for fatigue. ?  ?Turmeric 500 MG Tabs ?Take 3 tablets by mouth daily. ?  ?valACYclovir 1000 MG tablet ?Commonly known as: VALTREX ?Take 2,000 mg by mouth 2 (two) times daily as needed (cold sores). ?  ?zolpidem 10 MG tablet ?Commonly known as: AMBIEN ?Take 1 tablet (10 mg total) by mouth at bedtime as needed for sleep. ?  ? ?  ? ?No Known Allergies ? ? ? ?The results of significant diagnostics from this hospitalization (including imaging, microbiology, ancillary and laboratory) are listed below for reference.   ? ?Significant Diagnostic Studies: ?DG Abdomen 1 View ? ?Result Date: 04/01/2022 ?CLINICAL DATA:  NG tube placement EXAM: ABDOMEN - 1 VIEW COMPARISON:  CT 04/01/2022 FINDINGS: Esophageal tube tip overlies the stomach. Dilated  small bowel in the left upper quadrant. Small amount of contrast in the renal collecting systems IMPRESSION: Esophageal tube tip overlies the stomach Electronically Signed   By: Jasmine Pang M.D.   On: 04/01/2022 23:25  ? ?CT ABDOMEN PELVIS W CONTRAST ? ?Result Date: 04/01/2022 ?CLINICAL DATA:  Nausea vomiting lack of appetite EXAM: CT ABDOMEN AND PELVIS WITH CONTRAST TECHNIQUE: Multidetector CT imaging of the abdomen and pelvis was performed using the standard protocol following bolus administration of intravenous contrast. RADIATION DOSE REDUCTION: This exam was performed according to the departmental dose-optimization program which includes automated exposure control, adjustment of the mA and/or kV according to patient size and/or use of iterative reconstruction technique. CONTRAST:  OMNIPAQUE IOHEXOL 300 MG/ML  SOLN COMPARISON:  CT report 02/07/2015, CT pelvis 03/19/2018 FINDINGS: Lower chest: Lung bases demonstrate  no acute consolidation or effusion. Normal cardiac size. Hepatobiliary: No focal liver abnormality is seen. No gallstones, gallbladder wall thickening, or biliary dilatation. Pancreas: Unremarkable. No panc

## 2022-04-07 NOTE — Plan of Care (Signed)
  Problem: Education: Goal: Knowledge of General Education information will improve Description: Including pain rating scale, medication(s)/side effects and non-pharmacologic comfort measures Outcome: Adequate for Discharge   Problem: Clinical Measurements: Goal: Ability to maintain clinical measurements within normal limits will improve Outcome: Adequate for Discharge Goal: Will remain free from infection Outcome: Adequate for Discharge Goal: Diagnostic test results will improve Outcome: Adequate for Discharge Goal: Respiratory complications will improve Outcome: Adequate for Discharge Goal: Cardiovascular complication will be avoided Outcome: Adequate for Discharge   Problem: Activity: Goal: Risk for activity intolerance will decrease Outcome: Adequate for Discharge   Problem: Nutrition: Goal: Adequate nutrition will be maintained Outcome: Adequate for Discharge   Problem: Elimination: Goal: Will not experience complications related to bowel motility Outcome: Adequate for Discharge Goal: Will not experience complications related to urinary retention Outcome: Adequate for Discharge   Problem: Pain Managment: Goal: General experience of comfort will improve Outcome: Adequate for Discharge   Problem: Skin Integrity: Goal: Risk for impaired skin integrity will decrease Outcome: Adequate for Discharge   

## 2022-04-07 NOTE — Discharge Instructions (Signed)
TALK TO YOUR PRIMARY CARE DOCTOR ABOUT A COLONOSCOPY AT THE AGE OF 41 YEARS OLD.  ?

## 2022-04-11 MED FILL — Hydromorphone HCl Inj 2 MG/ML: INTRAMUSCULAR | Qty: 0.5 | Status: AC

## 2022-11-24 HISTORY — PX: OTHER SURGICAL HISTORY: SHX169

## 2023-02-23 ENCOUNTER — Ambulatory Visit (HOSPITAL_COMMUNITY): Payer: Self-pay | Admitting: Orthopedic Surgery

## 2023-02-23 DIAGNOSIS — K56609 Unspecified intestinal obstruction, unspecified as to partial versus complete obstruction: Secondary | ICD-10-CM

## 2023-02-23 HISTORY — DX: Unspecified intestinal obstruction, unspecified as to partial versus complete obstruction: K56.609

## 2023-03-04 NOTE — Pre-Procedure Instructions (Addendum)
Surgical Instructions    Your procedure is scheduled on March 16, 2023.  Report to [facility] Main Entrance "A" at 5:30 A.M., then check in with the Admitting office.  Call this number if you have problems the morning of surgery:  [phone]  If you have any questions prior to your surgery date call [phone]: Open Monday-Friday 8am-4pm If you experience any cold or flu symptoms such as cough, fever, chills, shortness of breath, etc. between now and your scheduled surgery, please notify us at the above number.     Remember:  Do not eat after midnight the night before your surgery  You may drink clear liquids until 4:30 AM the morning of your surgery.   Clear liquids allowed are: Water, Non-Citrus Juices (without pulp), Carbonated Beverages, Clear Tea, Black Coffee Only (NO MILK, CREAM OR POWDERED CREAMER of any kind), and Gatorade.  Patient Instructions  The night before surgery:  No food after midnight. ONLY clear liquids after midnight  The day of surgery (if you do NOT have diabetes):  Drink ONE (1) Pre-Surgery Clear Ensure by 4:30 AM the morning of surgery. Drink in one sitting. Do not sip.  This drink was given to you during your hospital  pre-op appointment visit.  Nothing else to drink after completing the  Pre-Surgery Clear Ensure.         If you have questions, please contact your surgeon's office.     Take these medicines the morning of surgery with A SIP OF WATER:  carboxymethylcellulose (REFRESH PLUS) - may take if needed   valACYclovir (VALTREX) - may take if needed   As of today, STOP taking any Aspirin (unless otherwise instructed by your surgeon) Aleve, Naproxen, Ibuprofen, Motrin, Advil, Goody's, BC's, all herbal medications, fish oil, and all vitamins.                     Do NOT Smoke (Tobacco/Vaping) for 24 hours prior to your procedure.  If you use a CPAP at night, you may bring your mask/headgear for your overnight stay.   Contacts, glasses,  piercing's, hearing aid's, dentures or partials may not be worn into surgery, please bring cases for these belongings.    For patients admitted to the hospital, discharge time will be determined by your treatment team.   Patients discharged the day of surgery will not be allowed to drive home, and someone needs to stay with them for 24 hours.  SURGICAL WAITING ROOM VISITATION Patients having surgery or a procedure may have no more than 2 support people in the waiting area - these visitors may rotate.   Children under the age of 78 must have an adult with them who is not the patient. If the patient needs to stay at the hospital during part of their recovery, the visitor guidelines for inpatient rooms apply. Pre-op nurse will coordinate an appropriate time for 1 support person to accompany patient in pre-op.  This support person may not rotate.   Please refer to the [facility] website for the visitor guidelines for Inpatients (after your surgery is over and you are in a regular room).   Please follow the instructions on the handout you received at your pre-admission appointment about preparing for your upcoming surgery using the CHG surgical soap. If you have any questions or concerns, please call one of the numbers listed on the first page of this paperwork.    Please read over the following fact sheets that you were given.  If you received a COVID test during your pre-op visit  it is requested that you wear a mask when out in public, stay away from anyone that may not be feeling well and notify your surgeon if you develop symptoms. If you have been in contact with anyone that has tested positive in the last 10 days please notify you surgeon.

## 2023-03-05 ENCOUNTER — Other Ambulatory Visit: Payer: Self-pay

## 2023-03-05 ENCOUNTER — Encounter (HOSPITAL_COMMUNITY)
Admission: RE | Admit: 2023-03-05 | Discharge: 2023-03-05 | Disposition: A | Discharge status: Home / Self Care | Payer: Commercial Managed Care - HMO | Source: Ambulatory Visit | Attending: Orthopedic Surgery | Admitting: Orthopedic Surgery

## 2023-03-05 VITALS — BP 138/86 | HR 77 | Temp 98.0°F | Resp 18 | Ht 72.0 in | Wt 231.3 lb

## 2023-03-05 DIAGNOSIS — Z01818 Encounter for other preprocedural examination: Secondary | ICD-10-CM | POA: Diagnosis not present

## 2023-03-05 DIAGNOSIS — A4902 Methicillin resistant Staphylococcus aureus infection, unspecified site: Secondary | ICD-10-CM

## 2023-03-05 HISTORY — DX: Methicillin resistant Staphylococcus aureus infection, unspecified site: A49.02

## 2023-03-05 LAB — TYPE AND SCREEN
ABO/RH(D): A POS
Antibody Screen: NEGATIVE

## 2023-03-05 LAB — CBC
HCT: 51.6 % (ref 39.0–52.0)
Hemoglobin: 16.8 g/dL (ref 13.0–17.0)
MCH: 26.9 pg (ref 26.0–34.0)
MCHC: 32.6 g/dL (ref 30.0–36.0)
MCV: 82.6 fL (ref 80.0–100.0)
Platelets: 241 10*3/uL (ref 150–400)
RBC: 6.25 MIL/uL — ABNORMAL HIGH (ref 4.22–5.81)
RDW: 13.3 % (ref 11.5–15.5)
WBC: 6.2 10*3/uL (ref 4.0–10.5)
nRBC: 0 % (ref 0.0–0.2)

## 2023-03-05 LAB — SURGICAL PCR SCREEN
MRSA, PCR: POSITIVE — AB
Staphylococcus aureus: POSITIVE — AB

## 2023-03-05 NOTE — Progress Notes (Signed)
PCP - Tracey Harries, MD  Cardiologist - denies  PPM/ICD - denies Device Orders - n/a Rep Notified - n/a  Chest x-ray - denies EKG - denies Stress Test - denies ECHO - denies Cardiac Cath - denies  Sleep Study - denies CPAP - no  Fasting Blood Sugar - no DM  Blood Thinner Instructions:n/a Aspirin Instructions: n/a  ERAS Protcol - yes; clear liquids until 4:30 AM   PRE-SURGERY Ensure or G2- yes  COVID TEST- n/a   Anesthesia review: yes, medical clearance.  Patient denies shortness of breath, fever, cough and chest pain at PAT appointment and the last two months.    All instructions explained to the patient, with a verbal understanding of the material. Patient agrees to go over the instructions while at home for a better understanding. Patient also instructed to self quarantine after being tested for COVID-19. The opportunity to ask questions was provided.

## 2023-03-06 NOTE — Progress Notes (Signed)
Cordelia Pen, [place] Scheduler for Dr. Shon Baton, notified of pts positive surgical PCR for +MRSA and +MSSA. She said that she would relay the message to Dr. Shon Baton.

## 2023-03-15 NOTE — Anesthesia Preprocedure Evaluation (Addendum)
Anesthesia Evaluation  Patient identified by MRN, date of birth, ID band Patient awake    Reviewed: Allergy & Precautions, NPO status , Patient's Chart, lab work & pertinent test results  History of Anesthesia Complications Negative for: history of anesthetic complications  Airway Mallampati: II  TM Distance: >3 FB Neck ROM: Full    Dental no notable dental hx. (+) Dental Advisory Given   Pulmonary neg pulmonary ROS   Pulmonary exam normal        Cardiovascular negative cardio ROS Normal cardiovascular exam     Neuro/Psych  PSYCHIATRIC DISORDERS Anxiety     PTSDnegative neurological ROS     GI/Hepatic negative GI ROS, Neg liver ROS,,,  Endo/Other  Obesity  Renal/GU negative Renal ROS  negative genitourinary   Musculoskeletal Left shoulder instability   Abdominal   Peds  Hematology negative hematology ROS (+)   Anesthesia Other Findings   Reproductive/Obstetrics ED Genital herpes                             Anesthesia Physical Anesthesia Plan  ASA: 2  Anesthesia Plan: General   Post-op Pain Management:  Regional for Post-op pain and Tylenol PO (pre-op)*   Induction: Intravenous  PONV Risk Score and Plan: 4 or greater and Treatment may vary due to age or medical condition, Midazolam, Ondansetron, Dexamethasone and Scopolamine patch - Pre-op  Airway Management Planned: Oral ETT and Video Laryngoscope Planned  Additional Equipment:   Intra-op Plan:   Post-operative Plan: Extubation in OR  Informed Consent: I have reviewed the patients History and Physical, chart, labs and discussed the procedure including the risks, benefits and alternatives for the proposed anesthesia with the patient or authorized representative who has indicated his/her understanding and acceptance.     Dental advisory given  Plan Discussed with: Anesthesiologist and CRNA  Anesthesia Plan Comments:          Anesthesia Quick Evaluation

## 2023-03-16 ENCOUNTER — Observation Stay (HOSPITAL_COMMUNITY)
Admission: RE | Admit: 2023-03-16 | Discharge: 2023-03-17 | Disposition: A | Discharge status: Home / Self Care | Payer: Commercial Managed Care - HMO | Attending: Orthopedic Surgery | Admitting: Orthopedic Surgery

## 2023-03-16 ENCOUNTER — Other Ambulatory Visit: Payer: Self-pay

## 2023-03-16 ENCOUNTER — Ambulatory Visit (HOSPITAL_COMMUNITY): Payer: Commercial Managed Care - HMO

## 2023-03-16 ENCOUNTER — Ambulatory Visit (HOSPITAL_COMMUNITY)
Admission: RE | Disposition: A | Discharge status: Home / Self Care | Payer: Self-pay | Source: Home / Self Care | Attending: Orthopedic Surgery

## 2023-03-16 ENCOUNTER — Encounter (HOSPITAL_COMMUNITY): Payer: Self-pay | Admitting: Orthopedic Surgery

## 2023-03-16 ENCOUNTER — Ambulatory Visit (HOSPITAL_BASED_OUTPATIENT_CLINIC_OR_DEPARTMENT_OTHER): Payer: Commercial Managed Care - HMO | Admitting: Anesthesiology

## 2023-03-16 ENCOUNTER — Ambulatory Visit (HOSPITAL_COMMUNITY): Payer: Commercial Managed Care - HMO | Admitting: Medical

## 2023-03-16 DIAGNOSIS — G959 Disease of spinal cord, unspecified: Secondary | ICD-10-CM | POA: Diagnosis present

## 2023-03-16 DIAGNOSIS — E669 Obesity, unspecified: Secondary | ICD-10-CM | POA: Diagnosis not present

## 2023-03-16 DIAGNOSIS — M4712 Other spondylosis with myelopathy, cervical region: Secondary | ICD-10-CM

## 2023-03-16 DIAGNOSIS — Z6831 Body mass index (BMI) 31.0-31.9, adult: Secondary | ICD-10-CM | POA: Diagnosis not present

## 2023-03-16 DIAGNOSIS — M4722 Other spondylosis with radiculopathy, cervical region: Secondary | ICD-10-CM | POA: Insufficient documentation

## 2023-03-16 DIAGNOSIS — F419 Anxiety disorder, unspecified: Secondary | ICD-10-CM

## 2023-03-16 HISTORY — PX: CERVICAL DISC ARTHROPLASTY: SHX587

## 2023-03-16 LAB — ABO/RH: ABO/RH(D): A POS

## 2023-03-16 SURGERY — CERVICAL ANTERIOR DISC ARTHROPLASTY
Anesthesia: General

## 2023-03-16 MED ORDER — LACTATED RINGERS IV SOLN: INTRAVENOUS | Status: DC | PRN

## 2023-03-16 MED ORDER — SUGAMMADEX SODIUM 200 MG/2ML IV SOLN
INTRAVENOUS | Status: DC | PRN
  Administered 2023-03-16: 200 mg via INTRAVENOUS

## 2023-03-16 MED ORDER — CEFAZOLIN SODIUM-DEXTROSE 1-4 GM/50ML-% IV SOLN
1.0000 g | Freq: Three times a day (TID) | INTRAVENOUS | Status: AC
  Administered 2023-03-16 (×2): 1 g via INTRAVENOUS
  Filled 2023-03-16 (×2): qty 50

## 2023-03-16 MED ORDER — OXYCODONE HCL 5 MG PO TABS
10.0000 mg | ORAL_TABLET | ORAL | Status: DC | PRN
  Administered 2023-03-16 – 2023-03-17 (×6): 10 mg via ORAL
  Filled 2023-03-16 (×6): qty 2

## 2023-03-16 MED ORDER — ORAL CARE MOUTH RINSE: 15.0000 mL | Freq: Once | OROMUCOSAL | Status: AC

## 2023-03-16 MED ORDER — ALBUMIN HUMAN 5 % IV SOLN: INTRAVENOUS | Status: DC | PRN

## 2023-03-16 MED ORDER — FENTANYL CITRATE (PF) 100 MCG/2ML IJ SOLN
INTRAMUSCULAR | Status: AC
  Filled 2023-03-16: qty 2

## 2023-03-16 MED ORDER — PROPOFOL 10 MG/ML IV BOLUS
INTRAVENOUS | Status: DC | PRN
  Administered 2023-03-16: 50 ug/kg/min via INTRAVENOUS
  Administered 2023-03-16: 150 mg via INTRAVENOUS

## 2023-03-16 MED ORDER — MIDAZOLAM HCL 2 MG/2ML IJ SOLN
INTRAMUSCULAR | Status: DC | PRN
  Administered 2023-03-16: 2 mg via INTRAVENOUS

## 2023-03-16 MED ORDER — PHENYLEPHRINE HCL-NACL 20-0.9 MG/250ML-% IV SOLN
INTRAVENOUS | Status: DC | PRN
  Administered 2023-03-16: 25 ug/min via INTRAVENOUS

## 2023-03-16 MED ORDER — KETAMINE HCL 50 MG/5ML IJ SOSY
PREFILLED_SYRINGE | INTRAMUSCULAR | Status: AC
  Filled 2023-03-16: qty 5

## 2023-03-16 MED ORDER — ONDANSETRON HCL 4 MG/2ML IJ SOLN: 4.0000 mg | Freq: Four times a day (QID) | INTRAMUSCULAR | Status: DC | PRN

## 2023-03-16 MED ORDER — POLYETHYLENE GLYCOL 3350 17 G PO PACK: 17.0000 g | PACK | Freq: Every day | ORAL | Status: DC | PRN

## 2023-03-16 MED ORDER — OXYCODONE HCL 5 MG PO TABS: 5.0000 mg | ORAL_TABLET | ORAL | Status: DC | PRN

## 2023-03-16 MED ORDER — SODIUM CHLORIDE 0.9% FLUSH: 3.0000 mL | INTRAVENOUS | Status: DC | PRN

## 2023-03-16 MED ORDER — SCOPOLAMINE 1 MG/3DAYS TD PT72: 1.0000 | MEDICATED_PATCH | TRANSDERMAL | Status: DC

## 2023-03-16 MED ORDER — LACTATED RINGERS IV SOLN: INTRAVENOUS | Status: DC

## 2023-03-16 MED ORDER — FENTANYL CITRATE (PF) 250 MCG/5ML IJ SOLN
INTRAMUSCULAR | Status: AC
  Filled 2023-03-16: qty 5

## 2023-03-16 MED ORDER — ONDANSETRON HCL 4 MG/2ML IJ SOLN
INTRAMUSCULAR | Status: DC | PRN
  Administered 2023-03-16: 4 mg via INTRAVENOUS

## 2023-03-16 MED ORDER — ACETAMINOPHEN 650 MG RE SUPP: 650.0000 mg | RECTAL | Status: DC | PRN

## 2023-03-16 MED ORDER — BUPIVACAINE-EPINEPHRINE (PF) 0.25% -1:200000 IJ SOLN
INTRAMUSCULAR | Status: AC
  Filled 2023-03-16: qty 30

## 2023-03-16 MED ORDER — MENTHOL 3 MG MT LOZG
1.0000 | LOZENGE | OROMUCOSAL | Status: DC | PRN
  Filled 2023-03-16: qty 9

## 2023-03-16 MED ORDER — 0.9 % SODIUM CHLORIDE (POUR BTL) OPTIME
TOPICAL | Status: DC | PRN
  Administered 2023-03-16 (×2): 1000 mL

## 2023-03-16 MED ORDER — ONDANSETRON HCL 4 MG PO TABS: 4.0000 mg | ORAL_TABLET | Freq: Four times a day (QID) | ORAL | Status: DC | PRN

## 2023-03-16 MED ORDER — ACETAMINOPHEN 500 MG PO TABS: 1000.0000 mg | ORAL_TABLET | Freq: Once | ORAL | Status: AC

## 2023-03-16 MED ORDER — METHOCARBAMOL 500 MG PO TABS
500.0000 mg | ORAL_TABLET | Freq: Four times a day (QID) | ORAL | Status: DC | PRN
  Administered 2023-03-16 – 2023-03-17 (×3): 500 mg via ORAL
  Filled 2023-03-16 (×3): qty 1

## 2023-03-16 MED ORDER — FENTANYL CITRATE (PF) 250 MCG/5ML IJ SOLN
INTRAMUSCULAR | Status: DC | PRN
  Administered 2023-03-16: 50 ug via INTRAVENOUS
  Administered 2023-03-16: 100 ug via INTRAVENOUS

## 2023-03-16 MED ORDER — ACETAMINOPHEN 325 MG PO TABS
650.0000 mg | ORAL_TABLET | ORAL | Status: DC | PRN
  Administered 2023-03-16 – 2023-03-17 (×4): 650 mg via ORAL
  Filled 2023-03-16 (×4): qty 2

## 2023-03-16 MED ORDER — MIDAZOLAM HCL 2 MG/2ML IJ SOLN
INTRAMUSCULAR | Status: AC
  Filled 2023-03-16: qty 2

## 2023-03-16 MED ORDER — SCOPOLAMINE 1 MG/3DAYS TD PT72
MEDICATED_PATCH | TRANSDERMAL | Status: AC
  Administered 2023-03-16: 1.5 mg via TRANSDERMAL
  Filled 2023-03-16: qty 1

## 2023-03-16 MED ORDER — DEXAMETHASONE SODIUM PHOSPHATE 10 MG/ML IJ SOLN
INTRAMUSCULAR | Status: DC | PRN
  Administered 2023-03-16: 10 mg via INTRAVENOUS

## 2023-03-16 MED ORDER — PROPOFOL 10 MG/ML IV BOLUS
INTRAVENOUS | Status: AC
  Filled 2023-03-16: qty 20

## 2023-03-16 MED ORDER — OXYCODONE-ACETAMINOPHEN 10-325 MG PO TABS: 1.0000 | ORAL_TABLET | Freq: Four times a day (QID) | ORAL | 0 refills | Status: AC | PRN

## 2023-03-16 MED ORDER — CEFAZOLIN SODIUM-DEXTROSE 2-4 GM/100ML-% IV SOLN
INTRAVENOUS | Status: AC
  Filled 2023-03-16: qty 100

## 2023-03-16 MED ORDER — SODIUM CHLORIDE 0.9 % IV SOLN: 250.0000 mL | INTRAVENOUS | Status: DC

## 2023-03-16 MED ORDER — PROMETHAZINE HCL 25 MG/ML IJ SOLN: 6.2500 mg | INTRAMUSCULAR | Status: DC | PRN

## 2023-03-16 MED ORDER — CEFAZOLIN SODIUM-DEXTROSE 2-4 GM/100ML-% IV SOLN
2.0000 g | INTRAVENOUS | Status: AC
  Administered 2023-03-16: 2 g via INTRAVENOUS

## 2023-03-16 MED ORDER — FENTANYL CITRATE (PF) 100 MCG/2ML IJ SOLN
25.0000 ug | INTRAMUSCULAR | Status: DC | PRN
  Administered 2023-03-16 (×2): 50 ug via INTRAVENOUS

## 2023-03-16 MED ORDER — ACETAMINOPHEN 500 MG PO TABS
ORAL_TABLET | ORAL | Status: AC
  Administered 2023-03-16: 1000 mg via ORAL
  Filled 2023-03-16: qty 2

## 2023-03-16 MED ORDER — KETAMINE HCL 10 MG/ML IJ SOLN
INTRAMUSCULAR | Status: DC | PRN
  Administered 2023-03-16: 30 mg via INTRAVENOUS

## 2023-03-16 MED ORDER — TRANEXAMIC ACID-NACL 1000-0.7 MG/100ML-% IV SOLN
INTRAVENOUS | Status: AC
  Filled 2023-03-16: qty 100

## 2023-03-16 MED ORDER — SODIUM CHLORIDE 0.9% FLUSH
3.0000 mL | Freq: Two times a day (BID) | INTRAVENOUS | Status: DC
  Administered 2023-03-16 (×2): 3 mL via INTRAVENOUS

## 2023-03-16 MED ORDER — HYDROMORPHONE HCL 1 MG/ML IJ SOLN
1.0000 mg | INTRAMUSCULAR | Status: DC | PRN
  Administered 2023-03-16: 1 mg via INTRAVENOUS
  Filled 2023-03-16: qty 1

## 2023-03-16 MED ORDER — METHOCARBAMOL 1000 MG/10ML IJ SOLN: 500.0000 mg | Freq: Four times a day (QID) | INTRAVENOUS | Status: DC | PRN

## 2023-03-16 MED ORDER — THROMBIN 20000 UNITS EX KIT
PACK | CUTANEOUS | Status: AC
  Filled 2023-03-16: qty 1

## 2023-03-16 MED ORDER — BUPIVACAINE-EPINEPHRINE 0.25% -1:200000 IJ SOLN
INTRAMUSCULAR | Status: DC | PRN
  Administered 2023-03-16: 4 mL

## 2023-03-16 MED ORDER — CHLORHEXIDINE GLUCONATE 0.12 % MT SOLN: 15.0000 mL | Freq: Once | OROMUCOSAL | Status: AC

## 2023-03-16 MED ORDER — TRANEXAMIC ACID-NACL 1000-0.7 MG/100ML-% IV SOLN
1000.0000 mg | INTRAVENOUS | Status: AC
  Administered 2023-03-16: 1000 mg via INTRAVENOUS

## 2023-03-16 MED ORDER — PHENOL 1.4 % MT LIQD: 1.0000 | OROMUCOSAL | Status: DC | PRN

## 2023-03-16 MED ORDER — ONDANSETRON HCL 4 MG PO TABS: 4.0000 mg | ORAL_TABLET | Freq: Three times a day (TID) | ORAL | 0 refills | Status: DC | PRN

## 2023-03-16 MED ORDER — CHLORHEXIDINE GLUCONATE 0.12 % MT SOLN
OROMUCOSAL | Status: AC
  Administered 2023-03-16: 15 mL via OROMUCOSAL
  Filled 2023-03-16: qty 15

## 2023-03-16 MED ORDER — THROMBIN 20000 UNITS EX SOLR
CUTANEOUS | Status: DC | PRN
  Administered 2023-03-16: 20000 [IU] via TOPICAL

## 2023-03-16 MED ORDER — SUCCINYLCHOLINE CHLORIDE 200 MG/10ML IV SOSY
PREFILLED_SYRINGE | INTRAVENOUS | Status: DC | PRN
  Administered 2023-03-16: 140 mg via INTRAVENOUS

## 2023-03-16 MED ORDER — METHOCARBAMOL 500 MG PO TABS: 500.0000 mg | ORAL_TABLET | Freq: Three times a day (TID) | ORAL | 0 refills | Status: AC | PRN

## 2023-03-16 MED ORDER — DEXMEDETOMIDINE HCL IN NACL 80 MCG/20ML IV SOLN
INTRAVENOUS | Status: DC | PRN
  Administered 2023-03-16: 4 ug via BUCCAL
  Administered 2023-03-16 (×2): 8 ug via BUCCAL

## 2023-03-16 MED ORDER — MAGNESIUM CITRATE PO SOLN: 1.0000 | Freq: Once | ORAL | Status: DC | PRN

## 2023-03-16 MED ORDER — ROCURONIUM BROMIDE 10 MG/ML (PF) SYRINGE
PREFILLED_SYRINGE | INTRAVENOUS | Status: DC | PRN
  Administered 2023-03-16 (×2): 10 mg via INTRAVENOUS
  Administered 2023-03-16: 40 mg via INTRAVENOUS
  Administered 2023-03-16: 10 mg via INTRAVENOUS

## 2023-03-16 MED ORDER — LIDOCAINE 2% (20 MG/ML) 5 ML SYRINGE
INTRAMUSCULAR | Status: DC | PRN
  Administered 2023-03-16: 100 mg via INTRAVENOUS

## 2023-03-16 MED ORDER — DEXAMETHASONE 4 MG PO TABS
4.0000 mg | ORAL_TABLET | Freq: Four times a day (QID) | ORAL | Status: DC
  Administered 2023-03-16 – 2023-03-17 (×3): 4 mg via ORAL
  Filled 2023-03-16 (×3): qty 1

## 2023-03-16 MED ORDER — AMISULPRIDE (ANTIEMETIC) 5 MG/2ML IV SOLN: 10.0000 mg | Freq: Once | INTRAVENOUS | Status: DC | PRN

## 2023-03-16 MED ORDER — DEXAMETHASONE SODIUM PHOSPHATE 4 MG/ML IJ SOLN
4.0000 mg | Freq: Four times a day (QID) | INTRAMUSCULAR | Status: DC
  Administered 2023-03-16: 4 mg via INTRAVENOUS
  Filled 2023-03-16: qty 1

## 2023-03-16 MED ORDER — VANCOMYCIN HCL 1500 MG/300ML IV SOLN
1500.0000 mg | Freq: Once | INTRAVENOUS | Status: AC
  Administered 2023-03-16: 1500 mg via INTRAVENOUS
  Filled 2023-03-16: qty 300

## 2023-03-16 SURGICAL SUPPLY — 56 items
AGENT HMST KT MTR STRL THRMB (HEMOSTASIS) ×1
BAG COUNTER SPONGE SURGICOUNT (BAG) ×2 IMPLANT
BAG SPNG CNTER NS LX DISP (BAG) ×2
BLADE CLIPPER SURG (BLADE) IMPLANT
BNDG COHESIVE 1X5 TAN STRL LF (GAUZE/BANDAGES/DRESSINGS) IMPLANT
CANISTER SUCT 3000ML PPV (MISCELLANEOUS) ×2 IMPLANT
CLSR STERI-STRIP ANTIMIC 1/2X4 (GAUZE/BANDAGES/DRESSINGS) ×2 IMPLANT
COVER MAYO STAND STRL (DRAPES) ×4 IMPLANT
COVER SURGICAL LIGHT HANDLE (MISCELLANEOUS) ×4 IMPLANT
DISC SIMPLIFY 2 HT4 (Neuro Prosthesis/Implant) IMPLANT
DRAIN CHANNEL 15F RND FF W/TCR (WOUND CARE) IMPLANT
DRAPE C-ARM 42X72 X-RAY (DRAPES) ×2 IMPLANT
DRAPE C-ARMOR (DRAPES) IMPLANT
DRAPE POUCH INSTRU U-SHP 10X18 (DRAPES) ×2 IMPLANT
DRAPE SURG 17X23 STRL (DRAPES) ×2 IMPLANT
DRAPE U-SHAPE 47X51 STRL (DRAPES) ×2 IMPLANT
DRSG OPSITE POSTOP 3X4 (GAUZE/BANDAGES/DRESSINGS) ×2 IMPLANT
DRSG OPSITE POSTOP 4X6 (GAUZE/BANDAGES/DRESSINGS) IMPLANT
DURAPREP 26ML APPLICATOR (WOUND CARE) ×2 IMPLANT
ELECT COATED BLADE 2.86 ST (ELECTRODE) ×2 IMPLANT
ELECT PENCIL ROCKER SW 15FT (MISCELLANEOUS) ×2 IMPLANT
ELECT REM PT RETURN 9FT ADLT (ELECTROSURGICAL) ×1
ELECTRODE REM PT RTRN 9FT ADLT (ELECTROSURGICAL) ×2 IMPLANT
GLOVE BIO SURGEON STRL SZ 6.5 (GLOVE) ×2 IMPLANT
GLOVE BIOGEL PI IND STRL 6.5 (GLOVE) ×2 IMPLANT
GLOVE BIOGEL PI IND STRL 8.5 (GLOVE) ×2 IMPLANT
GLOVE SS BIOGEL STRL SZ 8.5 (GLOVE) ×2 IMPLANT
GOWN STRL REUS W/ TWL LRG LVL3 (GOWN DISPOSABLE) ×4 IMPLANT
GOWN STRL REUS W/TWL 2XL LVL3 (GOWN DISPOSABLE) ×2 IMPLANT
GOWN STRL REUS W/TWL LRG LVL3 (GOWN DISPOSABLE) ×3
KIT BASIN OR (CUSTOM PROCEDURE TRAY) ×2 IMPLANT
KIT TURNOVER KIT B (KITS) ×2 IMPLANT
NDL SPNL 18GX3.5 QUINCKE PK (NEEDLE) ×2 IMPLANT
NEEDLE SPNL 18GX3.5 QUINCKE PK (NEEDLE) ×1 IMPLANT
NS IRRIG 1000ML POUR BTL (IV SOLUTION) ×2 IMPLANT
PACK ORTHO CERVICAL (CUSTOM PROCEDURE TRAY) ×2 IMPLANT
PACK UNIVERSAL I (CUSTOM PROCEDURE TRAY) ×2 IMPLANT
PAD ARMBOARD 7.5X6 YLW CONV (MISCELLANEOUS) ×4 IMPLANT
PIN DISTRACTION MAXCESS-C 14 (PIN) IMPLANT
POSITIONER HEAD DONUT 9IN (MISCELLANEOUS) ×2 IMPLANT
RESTRAINT LIMB HOLDER UNIV (RESTRAINTS) ×2 IMPLANT
SPONGE INTESTINAL PEANUT (DISPOSABLE) IMPLANT
SPONGE SURGIFOAM ABS GEL SZ50 (HEMOSTASIS) ×2 IMPLANT
SURGIFLO W/THROMBIN 8M KIT (HEMOSTASIS) ×2 IMPLANT
SUT BONE WAX W31G (SUTURE) ×2 IMPLANT
SUT MNCRL AB 3-0 PS2 27 (SUTURE) ×2 IMPLANT
SUT SILK 3 0 TIES 17X18 (SUTURE)
SUT SILK 3-0 18XBRD TIE BLK (SUTURE) IMPLANT
SUT VIC AB 2-0 CT1 18 (SUTURE) ×2 IMPLANT
SYR BULB IRRIG 60ML STRL (SYRINGE) ×2 IMPLANT
SYR CONTROL 10ML LL (SYRINGE) IMPLANT
TAPE CLOTH 4X10 WHT NS (GAUZE/BANDAGES/DRESSINGS) ×2 IMPLANT
TAPE UMBILICAL 1/8X30 (MISCELLANEOUS) ×2 IMPLANT
TOWEL GREEN STERILE (TOWEL DISPOSABLE) ×2 IMPLANT
TOWEL GREEN STERILE FF (TOWEL DISPOSABLE) ×2 IMPLANT
WATER STERILE IRR 1000ML POUR (IV SOLUTION) ×2 IMPLANT

## 2023-03-16 NOTE — Anesthesia Postprocedure Evaluation (Signed)
Anesthesia Post Note  Patient: Micheal Zuniga  Procedure(s) Performed: CERVICAL ANTERIOR DISC ARTHROPLASTY C4-C6     Patient location during evaluation: PACU Anesthesia Type: General Level of consciousness: sedated Pain management: pain level controlled Vital Signs Assessment: post-procedure vital signs reviewed and stable Respiratory status: spontaneous breathing and respiratory function stable Cardiovascular status: stable Postop Assessment: no apparent nausea or vomiting Anesthetic complications: no   No notable events documented.  Last Vitals:  Vitals:   03/16/23 1215 03/16/23 1230  BP: 123/77 115/69  Pulse: 83 85  Resp: 14 15  Temp:    SpO2: 96% 96%    Last Pain:  Vitals:   03/16/23 1230  TempSrc:   PainSc: Asleep                 Magan Winnett DANIEL

## 2023-03-16 NOTE — Op Note (Signed)
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  4mm height / medium  BRIEF HISTORY: Micheal Zuniga is a 41 y.o. male who been under my care for some time.  Patient has cord signal changes and signs and symptoms of myeloradiculopathy.  Despite conservative care his quality of life is continued to deteriorate.  As result of the progressive nature of his symptoms we elected to move forward with surgery.  All appropriate risks, benefits, and alternatives were discussed with the patient and consent was obtained.  PROCEDURE DETAILS: Patient was brought into the operating room and was properly positioned on the operating room table.  After induction with general anesthesia the patient was endotracheally intubated.  A timeout was taken to confirm all important data: including patient, procedure, and the level. Teds, SCD's were applied.   The anterior cervical spine was prepped and draped in a standard fashion.  Using fluoroscopy I marked out the incision site.  This was centered over the C5 vertebral body.  I infiltrated the incision site with quarter percent Marcaine with epinephrine and then made a transverse incision.  Sharp dissection was carried out down to and through the platysma.  I then identified the medial border of the sternocleidomastoid and performing a standard Smith-Robinson approach I dissected through the deep cervical fascia.  I made sure to sweep the esophagus and trachea to the right and identify and protect the carotid sheath laterally with my finger.  I continued to bluntly dissect  through the prevertebral fascia until the anterior longitudinal ligament came to site.  For better visualization the omohyoid muscle was sacrificed.  At this point I could now clearly visualized from the superior aspect of C4 to the inferior aspect of C6.  A needle was placed into the C4-5 disc space and an x-ray was taken confirming I was at the appropriate level.  I mobilized the longus coli muscles with the bipolar cautery then placed my Caspar retracting blades underneath the longus coli muscle.  The endotracheal cuff was deflated I expanded the retractors to the appropriate width and then reinflated the endotracheal cuff.  An annulotomy was performed at C4-5 with a 15 blade scalpel and I remove the bulk of the disc material with pituitary rongeurs.  The overhanging osteophyte from the inferior aspect of the C4 vertebral body was trimmed down with a Kerrison rongeur.  Distraction pins were placed in the center of the C4 and C5 vertebral body as seen on AP and lateral imaging studies.  I then distracted the intervertebral space and maintained that with the distraction pin set.  I continued using curettes to remove all of the disc material until I arrived to the posterior annulus.  Using a 1 Kerrison and fine nerve hook I was able to resect the posterior annulus and trim down the posterior osteophytes.  Disc osteophyte complex consistent with what was seen on the MRI was then gently teased out and removed.  I then developed the plane underneath the posterior longitudinal ligament and resected this with a 1 mm Kerrison rongeur.  I then undercut the uncovertebral joints.  Using live fluoroscopy I confirmed  that parallel endplate distraction.  At this point I was pleased with the discectomy at this level.  I then trialed the 4 mm medium implant which provided the best overall fit.  It did not overstuff the compartment and it provided adequate distraction.  At this point I move forward with the C5-6 disc.  Using  the same technique as used at C4-5 and annulotomy was performed at C5-6 and I completed the discectomy in the same fashion.  I trimmed down the posterior annulus and resected the posterior longitudinal ligament using a fine nerve hook and #1 Kerrison rongeur.  Calcified fragments of disc material were removed.  I again confirmed that parallel endplate distraction using live fluoroscopy.  With both discectomies complete I then trialed the C5-6 level and elected to use the same size implant.  The 4 mm medium implant was obtained after the disc spaces were prepped with the osteotome to create the fin cuts.  The implants were inserted to the appropriate depth.  This was done at both levels.  I confirmed satisfactory positioning in both the AP and lateral planes.  Distraction pins were removed and the bleeding portion of bone was sealed with bone wax.  I then irrigated the wound copiously normal saline to make sure that hemostasis.  There were some small areas of bone bleeding that was sealed with bone wax.  With hemostasis confirmed and final x-rays satisfactory I returned the trach and esophagus to midline and closed the platysma with interrupted 2-0 Vicryl suture.  3-0 Monocryl was used to close the skin.  Steri-Strips strips and a dry dressing were applied.  Patient was ultimately extubated and transferred to the PACU without incident.  The end of the case all needle sponge counts were correct.   Venita Lick, MD 03/16/2023 11:28 AM

## 2023-03-16 NOTE — Transfer of Care (Signed)
Immediate Anesthesia Transfer of Care Note  Patient: Micheal Zuniga  Procedure(s) Performed: CERVICAL ANTERIOR DISC ARTHROPLASTY C4-C6  Patient Location: PACU  Anesthesia Type:General  Level of Consciousness: drowsy  Airway & Oxygen Therapy: Patient Spontanous Breathing and Patient connected to face mask oxygen  Post-op Assessment: Report given to RN and Post -op Vital signs reviewed and stable  Post vital signs: Reviewed and stable  Last Vitals:  Vitals Value Taken Time  BP 142/57 03/16/23 1139  Temp 36.7 C 03/16/23 1139  Pulse 78 03/16/23 1143  Resp 15 03/16/23 1143  SpO2 98 % 03/16/23 1143  Vitals shown include unvalidated device data.  Last Pain:  Vitals:   03/16/23 1139  TempSrc:   PainSc: Asleep         Complications: No notable events documented.

## 2023-03-16 NOTE — Anesthesia Procedure Notes (Signed)
Procedure Name: Intubation Date/Time: 03/16/2023 7:45 AM  Performed by: Loleta Tekelia Kareem, CRNAPre-anesthesia Checklist: Patient identified, Patient being monitored, Timeout performed, Emergency Drugs available and Suction available Patient Re-evaluated:Patient Re-evaluated prior to induction Oxygen Delivery Method: Circle system utilized Preoxygenation: Pre-oxygenation with 100% oxygen Induction Type: IV induction Ventilation: Mask ventilation without difficulty Laryngoscope Size: Glidescope and 4 Grade View: Grade I Tube type: Oral Tube size: 7.5 mm Number of attempts: 1 Airway Equipment and Method: Stylet Placement Confirmation: ETT inserted through vocal cords under direct vision, positive ETCO2 and breath sounds checked- equal and bilateral Secured at: 24 cm Tube secured with: Tape Dental Injury: Teeth and Oropharynx as per pre-operative assessment
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
## 2023-03-16 NOTE — Progress Notes (Signed)
Orthopedic Tech Progress Note Patient Details:  Micheal Zuniga 1981-06-04 [id number]  PACU RN called requesting a SOFT COLLAR, I asked her what happened to th e collar I ordered from [facility] , per RN MD ordered the wrong collar.   Ortho Devices Type of Ortho Device: Soft collar Ortho Device/Splint Location: NECK Ortho Device/Splint Interventions: Ordered   Post Interventions Patient Tolerated: Well Instructions Provided: Care of device  [name] 03/16/2023, 1:35 PM

## 2023-03-16 NOTE — Discharge Instructions (Signed)

## 2023-03-16 NOTE — Progress Notes (Signed)
Orthopedic Tech Progress Note Patient Details:  Micheal Zuniga 21-Dec-1980 [id number]  OR RN called requesting an ASPEN COLLAR. Called in order STAT to [facility]   Patient ID: [name], male   DOB: December 29, 1980, 42 y.o.   MRN: [id number]  [name] 03/16/2023, 9:21 AM

## 2023-03-16 NOTE — Brief Op Note (Signed)
03/16/2023  11:38 AM  PATIENT:  Micheal Zuniga  42 y.o. male  PRE-OPERATIVE DIAGNOSIS:  Cervical myelopathy and radiculopathy  POST-OPERATIVE DIAGNOSIS:  Cervical myelopathy and radiculopathy  PROCEDURE:  Procedure(s) with comments: CERVICAL ANTERIOR DISC ARTHROPLASTY C4-C6 (N/A) - 3 hrs 3 C-Bed  SURGEON:  Surgeon(s) and Role:    Venita Lick, MD - Primary  PHYSICIAN ASSISTANT:   ASSISTANTS: none   ANESTHESIA:   general  EBL:  50 mL   BLOOD ADMINISTERED:none  DRAINS: none   LOCAL MEDICATIONS USED:  MARCAINE     SPECIMEN:  No Specimen  DISPOSITION OF SPECIMEN:  N/A  COUNTS:  YES  TOURNIQUET:  * No tourniquets in log *  DICTATION: .Dragon Dictation  PLAN OF CARE: Admit for overnight observation  PATIENT DISPOSITION:  PACU - hemodynamically stable.

## 2023-03-17 DIAGNOSIS — M4712 Other spondylosis with myelopathy, cervical region: Secondary | ICD-10-CM | POA: Diagnosis not present

## 2023-03-17 MED FILL — Thrombin For Soln Kit 20000 Unit: CUTANEOUS | Qty: 1 | Status: AC

## 2023-03-17 NOTE — Progress Notes (Signed)
PT Cancellation Note  Patient Details Name: Micheal Zuniga MRN: [id number] DOB: 1981-04-16   Cancelled Treatment:    Reason Eval/Treat Not Completed: PT screened, no needs identified, will sign off. Spoke with OT who concurred screening this pt. Please re-consult if needed in future.  Lewis Shock, PT, DPT Acute [facility] Services Secure chat preferred Office #: [phone]   Micheal Zuniga 03/17/2023, 9:12 AM

## 2023-03-17 NOTE — Progress Notes (Signed)
Patient alert and oriented, mae's well, voiding adequate amount of urine, swallowing without difficulty, no c/o pain at time of discharge. Patient discharged home with family. Script and discharged instructions given to patient. Patient stated understanding of instructions given. Patient has an appointment with Dr. Shon Baton
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
## 2023-03-17 NOTE — Evaluation (Signed)
Occupational Therapy Evaluation Patient Details Name: Micheal Zuniga MRN: [id number] DOB: Sep 20, 1981 Today's Date: 03/17/2023   History of Present Illness 42 yo male s/p 4/22 total disc arthoplasty C4-6 PMH chronic neck and neuropathic arm pain   Clinical Impression   Patient evaluated by Occupational Therapy with no further acute OT needs identified. All education has been completed and the patient has no further questions. See below for any follow-up Occupational Therapy or equipment needs. OT to sign off. Thank you for referral.        Recommendations for follow up therapy are one component of a multi-disciplinary discharge planning process, led by the attending physician.  Recommendations may be updated based on patient status, additional functional criteria and insurance authorization.   Assistance Recommended at Discharge None  Patient can return home with the following      Functional Status Assessment  Patient has had a recent decline in their functional status and demonstrates the ability to make significant improvements in function in a reasonable and predictable amount of time.  Equipment Recommendations  None recommended by OT    Recommendations for Other Services       Precautions / Restrictions Precautions Precautions: Cervical Precaution Comments: provided handout and reviewed for adls Required Braces or Orthoses: Cervical Brace Cervical Brace: Soft collar;For comfort (has hard collar for car and high level activity in room but not required)      Mobility Bed Mobility Overal bed mobility: Independent                  Transfers Overall transfer level: Independent                        Balance                                           ADL either performed or assessed with clinical judgement   ADL Overall ADL's : Modified independent                                       General ADL Comments:  Cervical precautions ( handout provided): Educated patient on don doff brace with return demonstration, educated on oral care using cups, washing face with cloth, never to wash directly on incision site, avoid neck rotation flexion and extension, positioning with pillows in chair for bil UE, sleeping positioning, a Pt educated on need to notify doctor / RN of swallowing changes or choking..       Vision Baseline Vision/History: 0 No visual deficits Ability to See in Adequate Light: 0 Adequate       Perception     Praxis      Pertinent Vitals/Pain Pain Assessment Pain Assessment: No/denies pain     Hand Dominance Right   Extremity/Trunk Assessment Upper Extremity Assessment Upper Extremity Assessment: Overall WFL for tasks assessed   Lower Extremity Assessment Lower Extremity Assessment: Overall WFL for tasks assessed   Cervical / Trunk Assessment Cervical / Trunk Assessment: Neck Surgery   Communication Communication Communication: No difficulties   Cognition Arousal/Alertness: Awake/alert Behavior During Therapy: WFL for tasks assessed/performed Overall Cognitive Status: Within Functional Limits for tasks assessed  General Comments  incision dry and intact at this time    Exercises     Shoulder Instructions      Home Living Family/patient expects to be discharged to:: Private residence Living Arrangements: Spouse/significant other;Children Available Help at Discharge: Family Type of Home: House Home Access: Stairs to enter Secretary/administrator of Steps: 10 Entrance Stairs-Rails: Left;Right Home Layout: Two level;1/2 bath on main level;Bed/bath upstairs     Bathroom Shower/Tub: Producer, television/film/video: Standard     Home Equipment: Other (comment) (bed that is electric)   Additional Comments: has children in the home dog and cat      Prior Functioning/Environment Prior Level of Function  : Independent/Modified Independent;Working/employed;Driving               ADLs Comments: works as Psychologist, prison and probation services Problem List:        OT Treatment/Interventions:      OT Goals(Current goals can be found in the care plan section) Acute Rehab OT Goals Patient Stated Goal: to go away  OT Frequency:      Co-evaluation              AM-PAC OT "6 Clicks" Daily Activity     Outcome Measure Help from another person eating meals?: None Help from another person taking care of personal grooming?: None Help from another person toileting, which includes using toliet, bedpan, or urinal?: None Help from another person bathing (including washing, rinsing, drying)?: None Help from another person to put on and taking off regular upper body clothing?: None Help from another person to put on and taking off regular lower body clothing?: None 6 Click Score: 24   End of Session Equipment Utilized During Treatment: Cervical collar Nurse Communication: Mobility status;Precautions  Activity Tolerance: Patient tolerated treatment well Patient left: in bed;with call bell/phone within reach  OT Visit Diagnosis: Unsteadiness on feet (R26.81)                Time: 9604-5409 OT Time Calculation (min): 16 min Charges:  OT General Charges $OT Visit: 1 Visit OT Evaluation $OT Eval Low Complexity: 1 Low   Brynn, OTR/L  Acute Rehabilitation Services Office: [phone] .   Mateo Flow 03/17/2023, 10:01 AM

## 2023-03-18 ENCOUNTER — Encounter (HOSPITAL_COMMUNITY): Payer: Self-pay | Admitting: Orthopedic Surgery

## 2023-10-27 ENCOUNTER — Other Ambulatory Visit: Payer: Self-pay | Admitting: Urology

## 2023-10-28 ENCOUNTER — Encounter (HOSPITAL_BASED_OUTPATIENT_CLINIC_OR_DEPARTMENT_OTHER): Payer: Self-pay | Admitting: Urology

## 2023-10-28 ENCOUNTER — Other Ambulatory Visit: Payer: Self-pay

## 2023-10-28 NOTE — Progress Notes (Addendum)
Spoke w/ via phone for pre-op interview---[facility] needs dos---- none        Lab results------none COVID test -----patient states asymptomatic no test needed Arrive at -------1145 on Monday, 11/02/23 NPO after MN NO Solid Food.  Clear liquids from MN until---1045 Med rec completed Medications to take morning of surgery -----none Diabetic medication -----n/a Patient instructed no nail polish to be worn day of surgery Patient instructed to bring photo id and insurance card day of surgery Patient aware to have Driver (ride ) / caregiver    for 24 hours after surgery - wife, Merry Proud Patient Special Instructions -----none Pre-Op special Instructions -----Surgeon orders are in but need to be second signed. Patient verbalized understanding of instructions that were given at this phone interview. Patient denies chest pain, sob, fever, cough at the interview.

## 2023-11-02 ENCOUNTER — Ambulatory Visit (HOSPITAL_BASED_OUTPATIENT_CLINIC_OR_DEPARTMENT_OTHER): Payer: Commercial Managed Care - HMO | Admitting: Anesthesiology

## 2023-11-02 ENCOUNTER — Encounter (HOSPITAL_BASED_OUTPATIENT_CLINIC_OR_DEPARTMENT_OTHER)
Admission: RE | Disposition: A | Discharge status: Home / Self Care | Payer: Self-pay | Source: Home / Self Care | Attending: Urology

## 2023-11-02 ENCOUNTER — Other Ambulatory Visit: Payer: Self-pay

## 2023-11-02 ENCOUNTER — Encounter (HOSPITAL_BASED_OUTPATIENT_CLINIC_OR_DEPARTMENT_OTHER): Payer: Self-pay | Admitting: Urology

## 2023-11-02 ENCOUNTER — Ambulatory Visit (HOSPITAL_BASED_OUTPATIENT_CLINIC_OR_DEPARTMENT_OTHER)
Admission: RE | Admit: 2023-11-02 | Discharge: 2023-11-02 | Disposition: A | Discharge status: Home / Self Care | Payer: Commercial Managed Care - HMO | Attending: Urology | Admitting: Urology

## 2023-11-02 DIAGNOSIS — E669 Obesity, unspecified: Secondary | ICD-10-CM | POA: Insufficient documentation

## 2023-11-02 DIAGNOSIS — M19012 Primary osteoarthritis, left shoulder: Secondary | ICD-10-CM | POA: Diagnosis not present

## 2023-11-02 DIAGNOSIS — Z6831 Body mass index (BMI) 31.0-31.9, adult: Secondary | ICD-10-CM | POA: Insufficient documentation

## 2023-11-02 DIAGNOSIS — F4310 Post-traumatic stress disorder, unspecified: Secondary | ICD-10-CM | POA: Insufficient documentation

## 2023-11-02 DIAGNOSIS — A630 Anogenital (venereal) warts: Secondary | ICD-10-CM | POA: Diagnosis present

## 2023-11-02 DIAGNOSIS — F419 Anxiety disorder, unspecified: Secondary | ICD-10-CM | POA: Insufficient documentation

## 2023-11-02 DIAGNOSIS — Z01818 Encounter for other preprocedural examination: Secondary | ICD-10-CM

## 2023-11-02 DIAGNOSIS — R519 Headache, unspecified: Secondary | ICD-10-CM | POA: Insufficient documentation

## 2023-11-02 HISTORY — PX: LESION DESTRUCTION: SHX5132

## 2023-11-02 HISTORY — DX: Presence of spectacles and contact lenses: Z97.3

## 2023-11-02 HISTORY — DX: Headache, unspecified: R51.9

## 2023-11-02 HISTORY — DX: Unspecified osteoarthritis, unspecified site: M19.90

## 2023-11-02 HISTORY — DX: Unspecified fall, initial encounter: W19.XXXA

## 2023-11-02 HISTORY — PX: CO2 LASER APPLICATION: SHX5778

## 2023-11-02 HISTORY — DX: Other chronic pain: G89.29

## 2023-11-02 SURGERY — CO2 LASER APPLICATION
Anesthesia: General | Site: Groin

## 2023-11-02 MED ORDER — PROPOFOL 10 MG/ML IV BOLUS
INTRAVENOUS | Status: DC | PRN
  Administered 2023-11-02: 200 mg via INTRAVENOUS
  Administered 2023-11-02: 20 mg via INTRAVENOUS

## 2023-11-02 MED ORDER — OXYCODONE-ACETAMINOPHEN 5-325 MG PO TABS: 1.0000 | ORAL_TABLET | ORAL | 0 refills | Status: AC | PRN

## 2023-11-02 MED ORDER — ONDANSETRON HCL 4 MG/2ML IJ SOLN
INTRAMUSCULAR | Status: DC | PRN
  Administered 2023-11-02: 4 mg via INTRAVENOUS

## 2023-11-02 MED ORDER — BACITRACIN ZINC 500 UNIT/GM EX OINT
TOPICAL_OINTMENT | CUTANEOUS | Status: DC | PRN
  Administered 2023-11-02: 1 via TOPICAL

## 2023-11-02 MED ORDER — FENTANYL CITRATE (PF) 250 MCG/5ML IJ SOLN
INTRAMUSCULAR | Status: DC | PRN
  Administered 2023-11-02 (×2): 25 ug via INTRAVENOUS
  Administered 2023-11-02: 50 ug via INTRAVENOUS

## 2023-11-02 MED ORDER — DEXMEDETOMIDINE HCL IN NACL 80 MCG/20ML IV SOLN
INTRAVENOUS | Status: DC | PRN
  Administered 2023-11-02 (×2): 10 ug via INTRAVENOUS

## 2023-11-02 MED ORDER — ONDANSETRON HCL 4 MG/2ML IJ SOLN: 4.0000 mg | Freq: Once | INTRAMUSCULAR | Status: DC | PRN

## 2023-11-02 MED ORDER — DEXAMETHASONE SODIUM PHOSPHATE 10 MG/ML IJ SOLN
INTRAMUSCULAR | Status: DC | PRN
  Administered 2023-11-02: 10 mg via INTRAVENOUS

## 2023-11-02 MED ORDER — HYDROMORPHONE HCL 1 MG/ML IJ SOLN
0.2500 mg | INTRAMUSCULAR | Status: DC | PRN
  Administered 2023-11-02 (×2): 0.25 mg via INTRAVENOUS

## 2023-11-02 MED ORDER — LIDOCAINE 2% (20 MG/ML) 5 ML SYRINGE
INTRAMUSCULAR | Status: DC | PRN
  Administered 2023-11-02: 100 mg via INTRAVENOUS

## 2023-11-02 MED ORDER — OXYCODONE HCL 5 MG PO TABS: 5.0000 mg | ORAL_TABLET | Freq: Once | ORAL | Status: DC | PRN

## 2023-11-02 MED ORDER — SODIUM CHLORIDE 0.9 % IV SOLN: INTRAVENOUS | Status: DC

## 2023-11-02 MED ORDER — FENTANYL CITRATE (PF) 100 MCG/2ML IJ SOLN
INTRAMUSCULAR | Status: AC
  Filled 2023-11-02: qty 2

## 2023-11-02 MED ORDER — STERILE WATER FOR IRRIGATION IR SOLN
Status: DC | PRN
  Administered 2023-11-02: 1000 mL

## 2023-11-02 MED ORDER — MIDAZOLAM HCL 2 MG/2ML IJ SOLN
INTRAMUSCULAR | Status: DC | PRN
  Administered 2023-11-02: 2 mg via INTRAVENOUS

## 2023-11-02 MED ORDER — PROPOFOL 10 MG/ML IV BOLUS
INTRAVENOUS | Status: AC
  Filled 2023-11-02: qty 20

## 2023-11-02 MED ORDER — OXYCODONE HCL 5 MG/5ML PO SOLN: 5.0000 mg | Freq: Once | ORAL | Status: DC | PRN

## 2023-11-02 MED ORDER — HYDROMORPHONE HCL 1 MG/ML IJ SOLN
INTRAMUSCULAR | Status: AC
  Filled 2023-11-02: qty 1

## 2023-11-02 MED ORDER — MIDAZOLAM HCL 2 MG/2ML IJ SOLN
INTRAMUSCULAR | Status: AC
  Filled 2023-11-02: qty 2

## 2023-11-02 MED ORDER — KETOROLAC TROMETHAMINE 30 MG/ML IJ SOLN
INTRAMUSCULAR | Status: DC | PRN
  Administered 2023-11-02: 30 mg via INTRAVENOUS

## 2023-11-02 MED ORDER — DROPERIDOL 2.5 MG/ML IJ SOLN: 0.6250 mg | Freq: Once | INTRAMUSCULAR | Status: DC | PRN

## 2023-11-02 MED ORDER — LACTATED RINGERS IV SOLN: INTRAVENOUS | Status: DC

## 2023-11-02 MED ORDER — BUPIVACAINE HCL (PF) 0.5 % IJ SOLN
INTRAMUSCULAR | Status: DC | PRN
  Administered 2023-11-02: 19 mL

## 2023-11-02 SURGICAL SUPPLY — 27 items
BLADE SURG 15 STRL LF DISP TIS (BLADE) IMPLANT
BNDG GAUZE DERMACEA FLUFF 4 (GAUZE/BANDAGES/DRESSINGS) ×2 IMPLANT
BRIEF MESH DISP LRG (UNDERPADS AND DIAPERS) ×2 IMPLANT
CLOTH BEACON ORANGE TIMEOUT ST (SAFETY) ×2 IMPLANT
COVER BACK TABLE 60X90IN (DRAPES) ×2 IMPLANT
COVER MAYO STAND STRL (DRAPES) ×2 IMPLANT
DEPRESSOR TONGUE 6 IN STERILE (GAUZE/BANDAGES/DRESSINGS) ×2 IMPLANT
DRAPE LAPAROTOMY 100X72 PEDS (DRAPES) ×2 IMPLANT
GAUZE 4X4 16PLY ~~LOC~~+RFID DBL (SPONGE) ×2 IMPLANT
GAUZE SPONGE 4X4 12PLY STRL (GAUZE/BANDAGES/DRESSINGS) IMPLANT
GLOVE BIO SURGEON STRL SZ7.5 (GLOVE) ×2 IMPLANT
GLOVE SURG SS PI 7.0 STRL IVOR (GLOVE) IMPLANT
GOWN STRL REUS W/TWL LRG LVL3 (GOWN DISPOSABLE) ×2 IMPLANT
GOWN STRL REUS W/TWL XL LVL3 (GOWN DISPOSABLE) ×2 IMPLANT
KIT TURNOVER CYSTO (KITS) ×2 IMPLANT
MANIFOLD NEPTUNE II (INSTRUMENTS) IMPLANT
NDL HYPO 22X1.5 SAFETY MO (MISCELLANEOUS) IMPLANT
NEEDLE HYPO 22X1.5 SAFETY MO (MISCELLANEOUS) ×1
PACK BASIN DAY SURGERY FS (CUSTOM PROCEDURE TRAY) ×2 IMPLANT
SLEEVE SCD COMPRESS KNEE MED (STOCKING) ×2 IMPLANT
SUT VIC AB 4-0 PS2 27 (SUTURE) IMPLANT
SYR CONTROL 10ML LL (SYRINGE) IMPLANT
TOWEL OR 17X24 6PK STRL BLUE (TOWEL DISPOSABLE) ×4 IMPLANT
TRAY DSU PREP LF (CUSTOM PROCEDURE TRAY) IMPLANT
TUBE CONNECTING 12X1/4 (SUCTIONS) IMPLANT
VACUUM HOSE 7/8X10 W/ WAND (MISCELLANEOUS) ×2 IMPLANT
WATER STERILE IRR 1000ML POUR (IV SOLUTION) IMPLANT

## 2023-11-02 NOTE — Progress Notes (Signed)
Very alert and oriented, ambulated to phase II without difficulty, gait very steady

## 2023-11-02 NOTE — Transfer of Care (Signed)
Immediate Anesthesia Transfer of Care Note  Patient: Micheal Zuniga  Procedure(s) Performed: CO2 LASER ABLATION OF GENITAL WARTS (Groin) EXCISION OF GENITAL WARTS (Groin)  Patient Location: PACU  Anesthesia Type:General  Level of Consciousness: sedated  Airway & Oxygen Therapy: Patient Spontanous Breathing and Patient connected to face mask oxygen  Post-op Assessment: Report given to RN and Post -op Vital signs reviewed and stable  Post vital signs: Reviewed and stable  Last Vitals:  Vitals Value Taken Time  BP 135/80 11/02/23 1457  Temp    Pulse 73 11/02/23 1459  Resp 13 11/02/23 1459  SpO2 99 % 11/02/23 1459  Vitals shown include unfiled device data.  Last Pain:  Vitals:   11/02/23 1201  TempSrc: Oral  PainSc: 0-No pain      Patients Stated Pain Goal: 5 (11/02/23 1201)  Complications: No notable events documented.

## 2023-11-02 NOTE — Anesthesia Procedure Notes (Signed)
Procedure Name: LMA Insertion Date/Time: 11/02/2023 2:11 PM  Performed by: Dairl Ponder, CRNAPre-anesthesia Checklist: Patient identified, Emergency Drugs available, Suction available and Patient being monitored Patient Re-evaluated:Patient Re-evaluated prior to induction Oxygen Delivery Method: Circle System Utilized Preoxygenation: Pre-oxygenation with 100% oxygen Induction Type: IV induction Ventilation: Mask ventilation without difficulty LMA: LMA inserted LMA Size: 5.0 Number of attempts: 1 Airway Equipment and Method: Bite block Placement Confirmation: positive ETCO2 Tube secured with: Tape Dental Injury: Teeth and Oropharynx as per pre-operative assessment

## 2023-11-02 NOTE — Discharge Instructions (Addendum)
Activity:  You are encouraged to ambulate frequently (about every hour during waking hours) to help prevent blood clots from forming in your legs or lungs.  However, you should not engage in any heavy lifting (> 10-15 lbs), strenuous activity, or straining.  Diet: You should advance your diet as instructed by your physician.  It will be normal to have some bloating, nausea, and abdominal discomfort intermittently.  Prescriptions:  You will be provided a prescription for pain medication to take as needed.  If your pain is not severe enough to require the prescription pain medication, you may take extra strength Tylenol instead which will have less side effects.  You should also take a prescribed stool softener to avoid straining with bowel movements as the prescription pain medication may constipate you.  Incisions: You may remove your dressing bandages 48 hours after surgery if not removed in the hospital.  You will either have some small staples or special tissue glue at each of the incision sites. Once the bandages are removed (if present), the incisions may stay open to air.  You may start showering (but not soaking or bathing in water) the 2nd day after surgery and the incisions simply need to be patted dry after the shower.  No additional care is needed.  What to call us about: You should call the office [phone]) if you develop fever > 101 or develop persistent vomiting. Activity:  You are encouraged to ambulate frequently (about every hour during waking hours) to help prevent blood clots from forming in your legs or lungs.  However, you should not engage in any heavy lifting (> 10-15 lbs), strenuous activity, or straining. Apply bacitracin to affected areas as they heal.         Post Anesthesia Home Care Instructions  Activity: Get plenty of rest for the remainder of the day. A responsible individual must stay with you for 24 hours following the procedure.  For the next 24 hours, DO  NOT: -Drive a car -Advertising copywriter -Drink alcoholic beverages -Take any medication unless instructed by your physician -Make any legal decisions or sign important papers.  Meals: Start with liquid foods such as gelatin or soup. Progress to regular foods as tolerated. Avoid greasy, spicy, heavy foods. If nausea and/or vomiting occur, drink only clear liquids until the nausea and/or vomiting subsides. Call your physician if vomiting continues.  Special Instructions/Symptoms: Your throat may feel dry or sore from the anesthesia or the breathing tube placed in your throat during surgery. If this causes discomfort, gargle with warm salt water. The discomfort should disappear within 24 hours.         No ibuprofen, Advil, Aleve, Motrin, ketorolac, meloxicam, naproxen, or other NSAIDS until after 8:45 pm today if needed.

## 2023-11-02 NOTE — Anesthesia Postprocedure Evaluation (Signed)
Anesthesia Post Note  Patient: Micheal Zuniga  Procedure(s) Performed: CO2 LASER ABLATION OF GENITAL WARTS (Groin) EXCISION OF GENITAL WARTS (Groin)     Patient location during evaluation: PACU Anesthesia Type: General Level of consciousness: awake and alert and oriented Pain management: pain level controlled Vital Signs Assessment: post-procedure vital signs reviewed and stable Respiratory status: spontaneous breathing, nonlabored ventilation and respiratory function stable Cardiovascular status: blood pressure returned to baseline and stable Postop Assessment: no apparent nausea or vomiting Anesthetic complications: no   No notable events documented.  Last Vitals:  Vitals:   11/02/23 1458 11/02/23 1500  BP: 135/80 (!) 130/95  Pulse:  73  Resp: 13 13  Temp:  (!) 36.2 C  SpO2:  99%    Last Pain:  Vitals:   11/02/23 1530  TempSrc:   PainSc: 7                  Sybrina Laning A.

## 2023-11-02 NOTE — Op Note (Signed)
Operative Note  Preoperative diagnosis:  1.  Genital warts  Postoperative diagnosis: 1.  Genital warts  Procedure(s): 1.  Excision of genital warts 2. Co2 laser ablation of genital warts  Surgeon: Jettie Pagan, MD  Assistants:  None  Anesthesia:  General  Complications:  None  EBL:  Minimal  Specimens: 1. Genital warts  Drains/Catheters: 1.  None  Intraoperative findings:   Four separate areas of genital warts in clusters - large cluster approximately 1cm at the the right penile-scrotal junction, another 1cm cluster at the proximal penile shaft region, another 0.75cm cluster at the left anterior scrotum. A cluster of 3 small warts at the distal penile shaft. The larger clusters were excised and skin re approximated and the penile lesions were ablated with CO2 laser. Excellent hemostasis  Indication:  [name] is a 42 y.o. male with a history of genital warts here for excision and CO2 laser ablation.   Description of procedure: The indications, alternatives, benefits, and risks were discussed with the patient and informed was obtained.  The patient was brought onto the operating room table, positioned supine, and secured with a safety strap.  All pressure points were carefully padded and pneumatic compression devices were placed on the lower extremities.  After the administration of intravenous antibiotics and general anesthesia, the patient scrotum was examined and the warts were identified in 4 separate areas: large cluster approximately 1cm at the the right penile-scrotal junction, another 1cm cluster at the proximal penile shaft region, another 0.75cm cluster at the left anterior scrotum and a cluster of 3 small warts at the distal penile shaft. The lower abdomen and external genitalia were prepped and draped in the standard sterile manner.  A timeout was completed, verifying the correct patient, surgical procedure, site, and positioning, prior to beginning the  procedure.  A scissor was used to excise each of the larger clusters of warts and this were sent to pathology. Next, the skin was re-approximated using 4-0 vicryl suture. This was completed in three locations. There was excellent hemostasis. Next, the penile shaft lesions were treated with CO2 laser ablation using a wattage of 7. Bacitracin was applied.  At the end of procedure all counts were correct.  The patient tolerated the procedure well and was taken to the recovery room stable condition.  Matt R. Cherysh Epperly MD [facility]  Pager: [phone]

## 2023-11-02 NOTE — Anesthesia Preprocedure Evaluation (Signed)
Anesthesia Evaluation  Patient identified by MRN, date of birth, ID band Patient awake    Reviewed: Allergy & Precautions, NPO status , Patient's Chart, lab work & pertinent test results  Airway Mallampati: II  TM Distance: >3 FB Neck ROM: Full    Dental  (+) Teeth Intact, Dental Advisory Given   Pulmonary neg pulmonary ROS   Pulmonary exam normal breath sounds clear to auscultation       Cardiovascular negative cardio ROS Normal cardiovascular exam Rhythm:Regular Rate:Normal     Neuro/Psych  Headaches PSYCHIATRIC DISORDERS Anxiety     PTSD   GI/Hepatic negative GI ROS, Neg liver ROS,,,  Endo/Other  Obesity  Renal/GU negative Renal ROS  negative genitourinary   Musculoskeletal  (+) Arthritis , Osteoarthritis,  Left shoulder instability   Abdominal  (+) + obese  Peds  Hematology negative hematology ROS (+)   Anesthesia Other Findings   Reproductive/Obstetrics Genital warts                               Anesthesia Physical Anesthesia Plan  ASA: 2  Anesthesia Plan: General   Post-op Pain Management:  Regional for Post-op pain and Minimal or no pain anticipated, Precedex and Dilaudid IV   Induction: Intravenous  PONV Risk Score and Plan: 3 and Treatment may vary due to age or medical condition, Midazolam, Ondansetron and Dexamethasone  Airway Management Planned: LMA  Additional Equipment: None  Intra-op Plan:   Post-operative Plan: Extubation in OR  Informed Consent: I have reviewed the patients History and Physical, chart, labs and discussed the procedure including the risks, benefits and alternatives for the proposed anesthesia with the patient or authorized representative who has indicated his/her understanding and acceptance.     Dental advisory given  Plan Discussed with: CRNA and Anesthesiologist  Anesthesia Plan Comments:          Anesthesia Quick  Evaluation
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
## 2023-11-03 ENCOUNTER — Encounter (HOSPITAL_BASED_OUTPATIENT_CLINIC_OR_DEPARTMENT_OTHER): Payer: Self-pay | Admitting: Urology

## 2023-11-03 LAB — SURGICAL PATHOLOGY

## 2023-11-18 IMAGING — DX DG ABDOMEN 2V
3 series · 3 of 3 positions shown · non-contrast
Comparison: Radiograph 04/03/2022

CLINICAL DATA: Small-bowel obstruction

EXAM:
ABDOMEN - 2 VIEW

[abdomen erect]
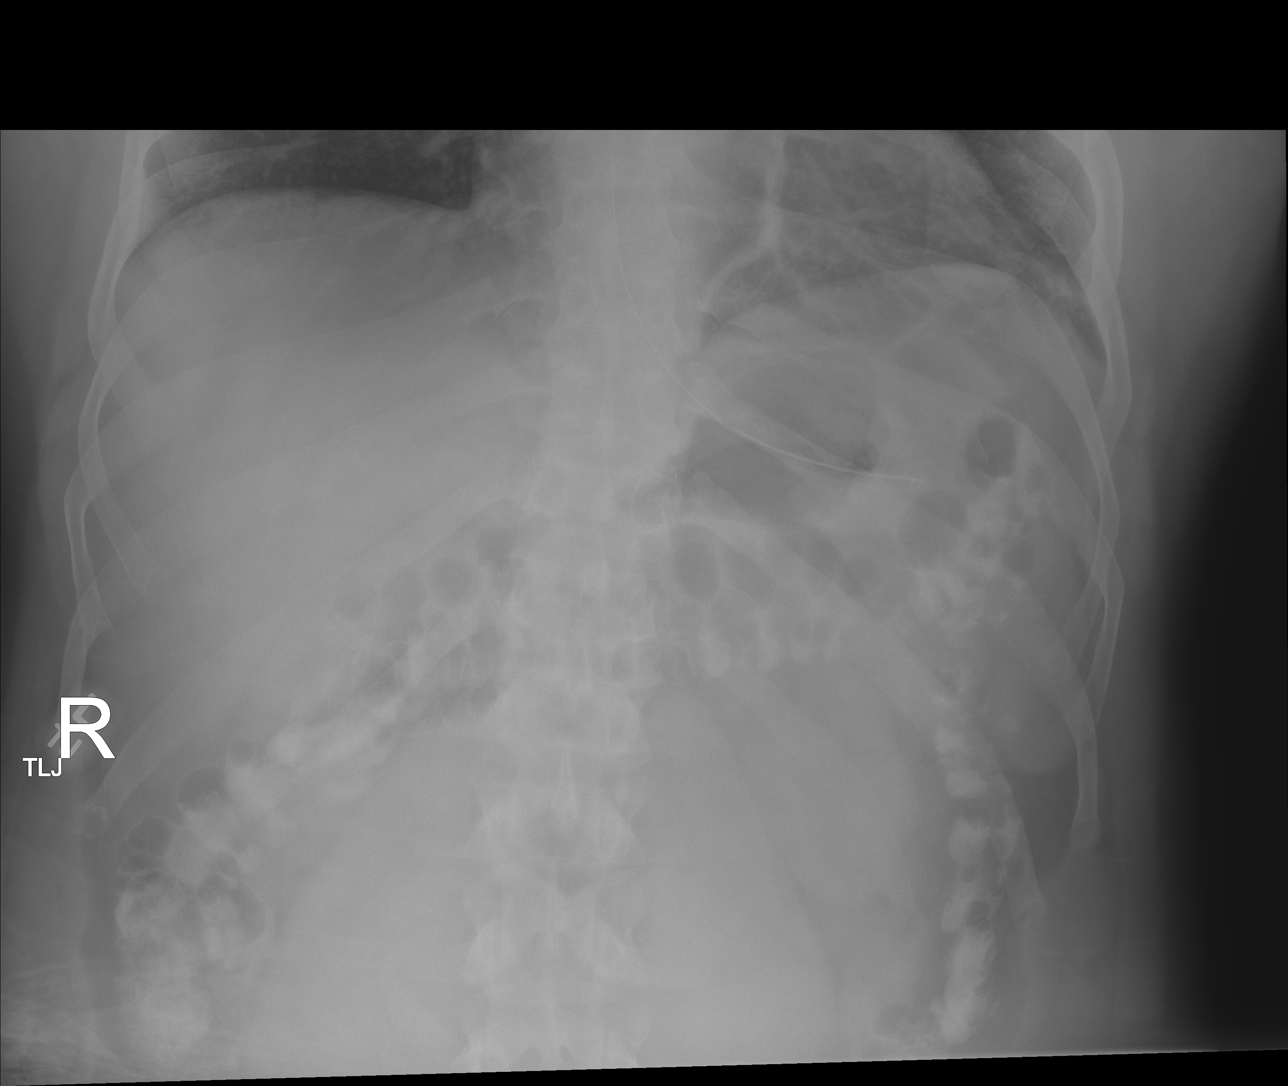

[abdomen supine (1 of 2)]
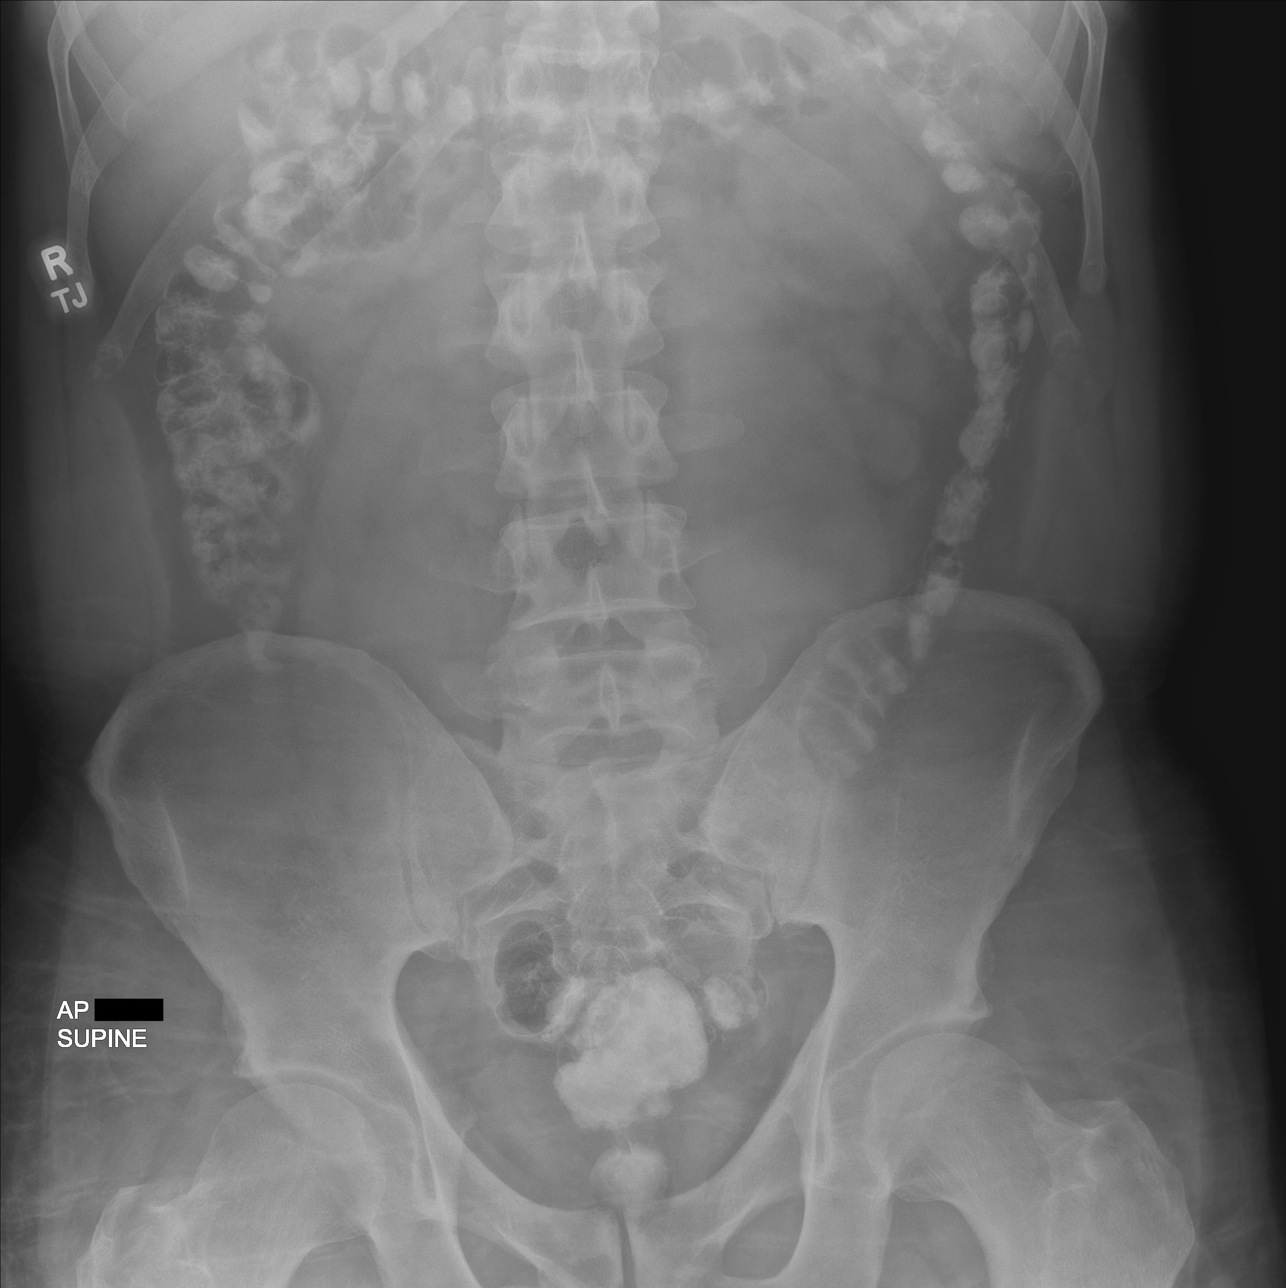

[abdomen supine (2 of 2)]
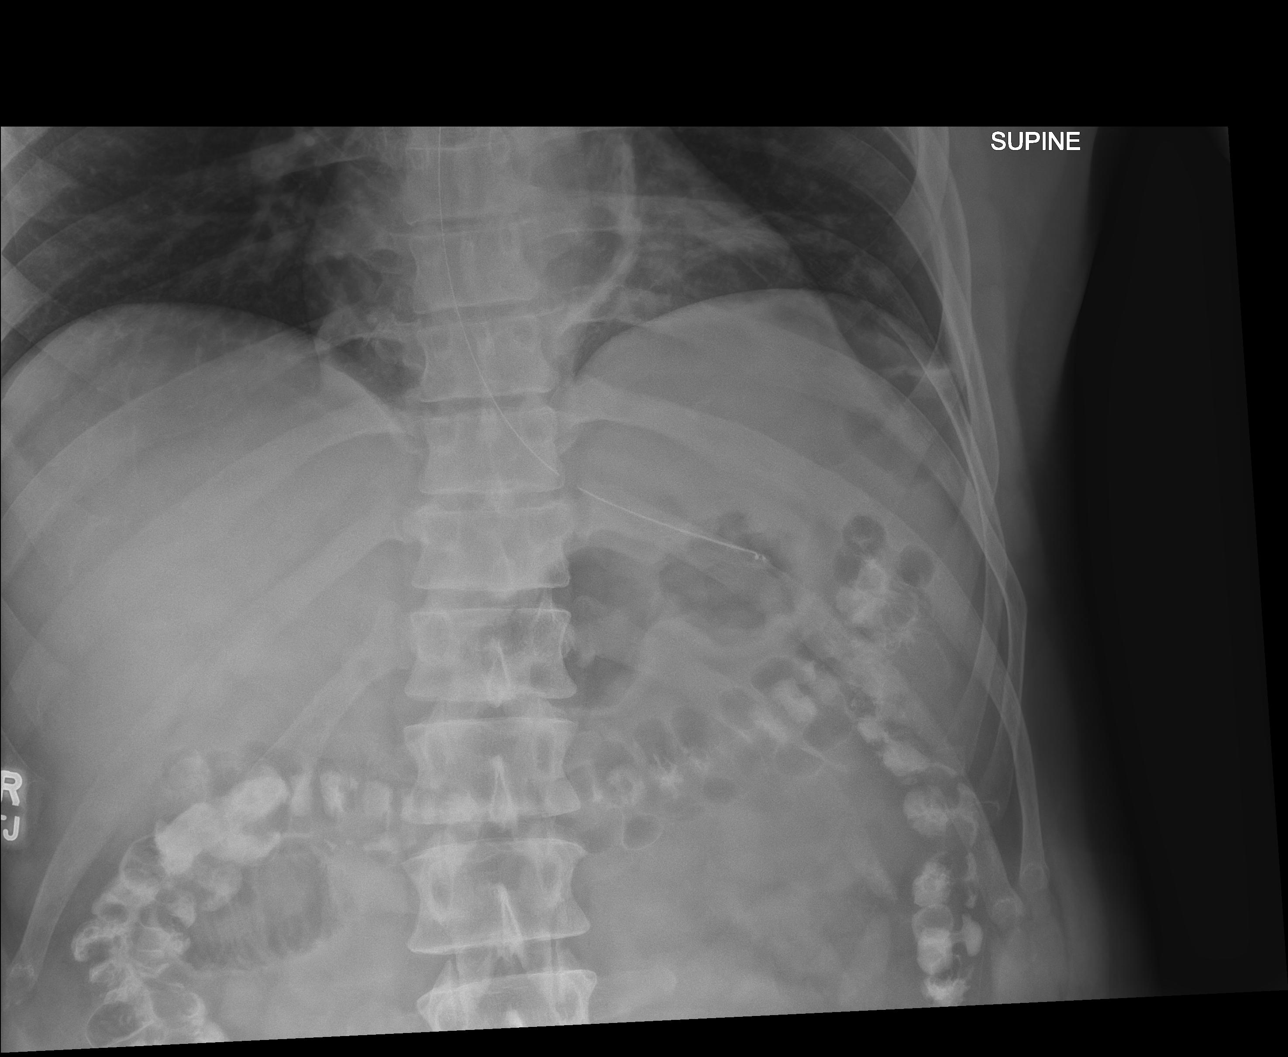

[3 of 3 positions shown; findings below may reference images not displayed]

FINDINGS: Nasogastric tube tip and side port overlie the stomach. There is
paucity of small bowel gas. There is contrast material retained in
the colon. There is a single dilated loop of small bowel in the
right upper quadrant which measures up to 3.8 cm, previously 3.8 cm.
IMPRESSION: Nasogastric tube tip and side port overlie the stomach. Persistent
single dilated loop of small bowel in the upper abdomen with
contrast material noted to pass into the colon, could indicate
partial or resolving obstruction.
